# Patient Record
Sex: Male | Born: 1970 | Race: Black or African American | Hispanic: No | Marital: Single | Smoking: Never smoker
Health system: Southern US, Community
[De-identification: ages and names within clinical notes are randomized; demographics above are authoritative.]

## PROBLEM LIST (undated history)

## (undated) DIAGNOSIS — R7303 Prediabetes: Secondary | ICD-10-CM

## (undated) DIAGNOSIS — Z8611 Personal history of tuberculosis: Secondary | ICD-10-CM

## (undated) DIAGNOSIS — L03221 Cellulitis of neck: Secondary | ICD-10-CM

## (undated) HISTORY — PX: MYRINGOTOMY: SUR874

## (undated) HISTORY — PX: VASECTOMY: SHX75

## (undated) HISTORY — PX: APPENDECTOMY: SHX54

## (undated) HISTORY — DX: Personal history of tuberculosis: Z86.11

## (undated) HISTORY — DX: Prediabetes: R73.03

---

## 2011-05-20 ENCOUNTER — Encounter (HOSPITAL_COMMUNITY): Payer: Self-pay | Admitting: *Deleted

## 2011-05-20 ENCOUNTER — Inpatient Hospital Stay (HOSPITAL_COMMUNITY)
Admission: EM | Admit: 2011-05-20 | Discharge: 2011-05-22 | DRG: 603 | Disposition: A | Discharge status: Home / Self Care | Payer: Self-pay | Source: Ambulatory Visit | Attending: Internal Medicine | Admitting: Internal Medicine

## 2011-05-20 ENCOUNTER — Emergency Department (HOSPITAL_COMMUNITY): Payer: Self-pay

## 2011-05-20 DIAGNOSIS — D72829 Elevated white blood cell count, unspecified: Secondary | ICD-10-CM | POA: Diagnosis present

## 2011-05-20 DIAGNOSIS — R509 Fever, unspecified: Secondary | ICD-10-CM | POA: Diagnosis present

## 2011-05-20 DIAGNOSIS — L0211 Cutaneous abscess of neck: Principal | ICD-10-CM | POA: Diagnosis present

## 2011-05-20 DIAGNOSIS — L03221 Cellulitis of neck: Secondary | ICD-10-CM | POA: Diagnosis present

## 2011-05-20 HISTORY — DX: Cellulitis of neck: L03.221

## 2011-05-20 LAB — BASIC METABOLIC PANEL
CO2: 27 mEq/L (ref 19–32)
Chloride: 104 mEq/L (ref 96–112)
GFR calc non Af Amer: 88 mL/min — ABNORMAL LOW (ref >90)
Glucose, Bld: 93 mg/dL (ref 70–99)
Potassium: 3.9 mEq/L (ref 3.5–5.1)
Sodium: 141 mEq/L (ref 135–145)

## 2011-05-20 LAB — DIFFERENTIAL
Lymphocytes Relative: 19 % (ref 12–46)
Lymphs Abs: 2.1 10*3/uL (ref 0.7–4.0)
Monocytes Relative: 10 % (ref 3–12)
Neutro Abs: 8 10*3/uL — ABNORMAL HIGH (ref 1.7–7.7)
Neutrophils Relative %: 71 % (ref 43–77)

## 2011-05-20 LAB — CBC
Hemoglobin: 15.1 g/dL (ref 13.0–17.0)
Platelets: 183 10*3/uL (ref 150–400)
RBC: 4.97 MIL/uL (ref 4.22–5.81)
WBC: 11.3 10*3/uL — ABNORMAL HIGH (ref 4.0–10.5)

## 2011-05-20 MED ORDER — MORPHINE SULFATE 4 MG/ML IJ SOLN
4.0000 mg | INTRAMUSCULAR | Status: DC | PRN
  Administered 2011-05-20 – 2011-05-21 (×3): 4 mg via INTRAVENOUS
  Filled 2011-05-20 (×3): qty 1

## 2011-05-20 MED ORDER — ACETAMINOPHEN 325 MG PO TABS
ORAL_TABLET | ORAL | Status: AC
  Filled 2011-05-20: qty 2

## 2011-05-20 MED ORDER — SODIUM CHLORIDE 0.9 % IV BOLUS (SEPSIS)
1000.0000 mL | Freq: Once | INTRAVENOUS | Status: AC
  Administered 2011-05-20: 1000 mL via INTRAVENOUS

## 2011-05-20 MED ORDER — VANCOMYCIN HCL IN DEXTROSE 1-5 GM/200ML-% IV SOLN: 1000.0000 mg | Freq: Once | INTRAVENOUS | Status: DC

## 2011-05-20 MED ORDER — ACETAMINOPHEN 325 MG PO TABS
650.0000 mg | ORAL_TABLET | Freq: Once | ORAL | Status: AC
  Administered 2011-05-20: 650 mg via ORAL

## 2011-05-20 MED ORDER — MORPHINE SULFATE 4 MG/ML IJ SOLN
4.0000 mg | Freq: Once | INTRAMUSCULAR | Status: AC
  Administered 2011-05-20: 4 mg via INTRAVENOUS
  Filled 2011-05-20: qty 1

## 2011-05-20 MED ORDER — ACETAMINOPHEN 325 MG PO TABS
650.0000 mg | ORAL_TABLET | Freq: Once | ORAL | Status: AC
  Administered 2011-05-20: 650 mg via ORAL
  Filled 2011-05-20: qty 2

## 2011-05-20 MED ORDER — ONDANSETRON HCL 4 MG/2ML IJ SOLN
4.0000 mg | Freq: Four times a day (QID) | INTRAMUSCULAR | Status: DC | PRN
  Administered 2011-05-21: 4 mg via INTRAVENOUS
  Filled 2011-05-20: qty 2

## 2011-05-20 MED ORDER — IOHEXOL 300 MG/ML  SOLN
80.0000 mL | Freq: Once | INTRAMUSCULAR | Status: AC | PRN
  Administered 2011-05-20: 80 mL via INTRAVENOUS

## 2011-05-20 MED ORDER — MORPHINE SULFATE 4 MG/ML IJ SOLN
4.0000 mg | Freq: Once | INTRAMUSCULAR | Status: AC
  Administered 2011-05-20: 4 mg via INTRAVENOUS

## 2011-05-20 MED ORDER — MORPHINE SULFATE 4 MG/ML IJ SOLN
INTRAMUSCULAR | Status: AC
  Filled 2011-05-20: qty 1

## 2011-05-20 MED ORDER — CLINDAMYCIN PHOSPHATE 600 MG/50ML IV SOLN
600.0000 mg | Freq: Three times a day (TID) | INTRAVENOUS | Status: DC
  Administered 2011-05-20 – 2011-05-22 (×5): 600 mg via INTRAVENOUS
  Filled 2011-05-20 (×10): qty 50

## 2011-05-20 MED ORDER — CLINDAMYCIN PHOSPHATE 600 MG/50ML IV SOLN
600.0000 mg | INTRAVENOUS | Status: AC
  Administered 2011-05-20: 600 mg via INTRAVENOUS
  Filled 2011-05-20: qty 50

## 2011-05-20 MED ORDER — ONDANSETRON HCL 4 MG/2ML IJ SOLN
4.0000 mg | Freq: Once | INTRAMUSCULAR | Status: AC
  Administered 2011-05-20: 4 mg via INTRAVENOUS
  Filled 2011-05-20: qty 2

## 2011-05-20 NOTE — ED Notes (Signed)
Patient transported to CT 

## 2011-05-20 NOTE — ED Notes (Signed)
Received report from Janet, RN.

## 2011-05-20 NOTE — ED Notes (Signed)
Current iv in rt hand is not flowing. Pt needs larger iv for ct with contrast. Attempted iv x 2 without success. IV team paged to access iv site.

## 2011-05-20 NOTE — ED Notes (Signed)
Received pt after report, pt is resting states neck pain is 4/10 when he is laying still. States pain increases to 10/10 when he moves his neck. Pt states current pain level is acceptable and denies any needs. Neck is warm, red and swollen. Wound is within previously marked edges. IVF infusing without difficulty. Will continue to monitor pt

## 2011-05-20 NOTE — ED Notes (Signed)
Pt alert getting blood drawn.  No distress

## 2011-05-20 NOTE — ED Notes (Addendum)
Pt ambulatory to ct scan

## 2011-05-20 NOTE — ED Provider Notes (Signed)
History     CSN: [id number]  Arrival date & time 05/20/11  0945   First MD Initiated Contact with Patient 05/20/11 1020      Chief Complaint  Patient presents with  . Neck Pain  . Fever    (Consider location/radiation/quality/duration/timing/severity/associated sxs/prior treatment) HPI  History reviewed. No pertinent past medical history.  History reviewed. No pertinent past surgical history.  History reviewed. No pertinent family history.  History  Substance Use Topics  . Smoking status: Never Smoker   . Smokeless tobacco: Not on file  . Alcohol Use: No      Review of Systems  Allergies  Review of patient's allergies indicates no known allergies.  Home Medications   Current Outpatient Rx  Name Route Sig Dispense Refill  . IBUPROFEN 800 MG PO TABS Oral Take 800 mg by mouth every 8 (eight) hours as needed.    Marland Kitchen OVER THE COUNTER MEDICATION Topical Apply 1 application topically daily as needed.      BP 121/78  Pulse 98  Temp(Src) 98.9 F (37.2 C) (Oral)  Resp 20  SpO2 99%  Physical Exam  ED Course  Procedures (including critical care time)  Labs Reviewed  CBC - Abnormal; Notable for the following:    WBC 11.3 (*)    All other components within normal limits  DIFFERENTIAL - Abnormal; Notable for the following:    Neutro Abs 8.0 (*)    Monocytes Absolute 1.2 (*)    All other components within normal limits  BASIC METABOLIC PANEL - Abnormal; Notable for the following:    GFR calc non Af Amer 88 (*)    All other components within normal limits   Ct Soft Tissue Neck W Contrast  05/20/2011  *RADIOLOGY REPORT*  Clinical Data: Left neck pain/swelling  CT NECK WITH CONTRAST  Technique:  Multidetector CT imaging of the neck was performed with intravenous contrast.  Contrast: 80mL OMNIPAQUE IOHEXOL 300 MG/ML IV SOLN  Comparison: None.  Findings: Patent airway.  No evidence of tonsillar/peritonsillar abscess.  No suspicious cervical lymphadenopathy.  Mild  subcutaneous soft tissue stranding along the right anterior neck (series 3/image 82), possibly reflecting cellulitis.  Visualized thyroid is unremarkable.  Mild degenerative changes of the cervical spine with prior posterior fixation/interbody fusion from C5-C7.  Visualized lung apices are clear.  IMPRESSION: Mild subcutaneous soft tissue stranding along the right anterior neck, possibly reflecting cellulitis.  No drainable fluid collection or abscess.  Patent airway.  Original Report Authenticated By: Charline Bills, M.D.     1. Cellulitis of neck     11:44 AM Handoff from Hunt PA-C at 11:15am. Patient to CDU on cellulitis protocol. Will be given IV clinda. Labs ordered. Will monitor for improvement.   1:10 PM patient seen and examined. CT reviewed by myself. Will monitor, plan additional dose of clindamycin in 6 hours.  1:10 PM Exam:  Gen NAD;Neck diffuse erythema and soft tissue swelling of right lateral neck, patient can swallow and is handling secretions Heart RRR, nml S1,S2, no m/r/g; Lungs CTAB; Abd soft, NT, no rebound or guarding; Ext 2+ pedal pulses bilaterally, no edema.  4:03 PM patient seen. No complaints.  11:00 PM patient seen earlier. Area of cellulitis is stable. The patient agrees to stay for the next dose of clindamycin which will be administered at approximately 5 AM. Handoff to Dr. Karma Ganja.  Plan: Discharge patient to home on oral antibiotics if medically stable or improving. Consider admission if worsening.  MDM  Cellulitis of neck,  continuing treatment in the CDU.       Eustace Moore [place], [place] 05/20/11 [phone]

## 2011-05-20 NOTE — ED Notes (Addendum)
Pt reports left neck discomfort since Thursday. On exam left side on neck is swollen and very warm to the touch. Pt reports he has felt like he has had fevers. Airway intact. Reports painful swallowing, states it feels like muscles in neck are swollen.

## 2011-05-20 NOTE — ED Notes (Signed)
Called for report from Herbert Deaner, RN

## 2011-05-20 NOTE — ED Provider Notes (Signed)
History     CSN: [id number]  Arrival date & time 05/20/11  0945   First MD Initiated Contact with Patient 05/20/11 1020      Chief Complaint  Patient presents with  . Neck Pain  . Fever    (Consider location/radiation/quality/duration/timing/severity/associated sxs/prior treatment) HPI  Patient presents to emergency department complaining of right anterior neck pain redness, swelling, and heat stating that on Thursday he shaved and had what appeared to be an ingrown hair that on Friday with your taken with faint area of redness and tenderness that by yesterday had grown in both redness and tenderness with pain with swallowing. Patient states pain is aggravated by movement and swallowing however is able to swallow secretions. Patient states he had a fever of 101 at home. Patient states she's taken over-the-counter ibuprofen and apply heat with mild improvement in pain ongoing redness and swelling. Patient has no known medical problems and takes no medicine on regular basis. Patient lives in [place] and is visiting [place] temporarily with plans to return to [place] within the next week. Patient states he has an established primary care physician in [place]. Patient denies headache, visual changes, earache, sore throat, chest pain, shortness of breath, swelling of tongue, swelling of lips, or swelling of the inside of throat. Symptoms were gradual onset, persistent, and worsening. Patient states he still has a small "pimple" at the site of the original ingrown hair.  History reviewed. No pertinent past medical history.  History reviewed. No pertinent past surgical history.  History reviewed. No pertinent family history.  History  Substance Use Topics  . Smoking status: Never Smoker   . Smokeless tobacco: Not on file  . Alcohol Use: No      Review of Systems  All other systems reviewed and are negative.    Allergies  Review of patient's allergies indicates no known  allergies.  Home Medications   Current Outpatient Rx  Name Route Sig Dispense Refill  . IBUPROFEN 800 MG PO TABS Oral Take 800 mg by mouth every 8 (eight) hours as needed.    Marland Kitchen OVER THE COUNTER MEDICATION Topical Apply 1 application topically daily as needed.      BP 121/78  Pulse 98  Temp(Src) 98.9 F (37.2 C) (Oral)  Resp 20  SpO2 99%  Physical Exam  Nursing note and vitals reviewed. Constitutional: He is oriented to person, place, and time. He appears well-developed and well-nourished. No distress.  HENT:  Head: Normocephalic and atraumatic.       Patent airway, swallowing secretions well. No pharyngeal or tonsillar erythema or exudate.  Eyes: Conjunctivae are normal.  Neck: Normal range of motion. Neck supple. No tracheal deviation present.       Pinpoint pustule of right anterior upper neck with large area of surrounding erythema tenderness to palpation but no fluctuance. No induration. Trachea midline. Patient swallowing secretions well.  Cardiovascular: Normal rate, regular rhythm, normal heart sounds and intact distal pulses.  Exam reveals no gallop and no friction rub.   No murmur heard. Pulmonary/Chest: Effort normal and breath sounds normal. No stridor. No respiratory distress. He has no wheezes. He has no rales. He exhibits no tenderness.  Abdominal: Soft. Bowel sounds are normal. He exhibits no distension and no mass. There is no tenderness. There is no rebound and no guarding.  Musculoskeletal: Normal range of motion. He exhibits no edema and no tenderness.  Lymphadenopathy:    He has cervical adenopathy.  Neurological: He is alert  and oriented to person, place, and time.  Skin: Skin is warm and dry. No rash noted. He is not diaphoretic. There is erythema.  Psychiatric: He has a normal mood and affect.    ED Course  Procedures (including critical care time)   IV vein, IV morphine, and IV Zofran.  Patient reviewed with Rhea Bleacher, physician assistant, who  will continue to follow the patient in the CDU with a CT soft tissue neck scan pending while patient is receiving IV fluids and IV antibiotics. Disposition pending CT scan results. Patient is stable and afebrile.  Labs Reviewed  CBC - Abnormal; Notable for the following:    WBC 11.3 (*)    All other components within normal limits  DIFFERENTIAL - Abnormal; Notable for the following:    Neutro Abs 8.0 (*)    Monocytes Absolute 1.2 (*)    All other components within normal limits  BASIC METABOLIC PANEL - Abnormal; Notable for the following:    GFR calc non Af Amer 88 (*)    All other components within normal limits   Ct Soft Tissue Neck W Contrast  05/20/2011  *RADIOLOGY REPORT*  Clinical Data: Left neck pain/swelling  CT NECK WITH CONTRAST  Technique:  Multidetector CT imaging of the neck was performed with intravenous contrast.  Contrast: 80mL OMNIPAQUE IOHEXOL 300 MG/ML IV SOLN  Comparison: None.  Findings: Patent airway.  No evidence of tonsillar/peritonsillar abscess.  No suspicious cervical lymphadenopathy.  Mild subcutaneous soft tissue stranding along the right anterior neck (series 3/image 82), possibly reflecting cellulitis.  Visualized thyroid is unremarkable.  Mild degenerative changes of the cervical spine with prior posterior fixation/interbody fusion from C5-C7.  Visualized lung apices are clear.  IMPRESSION: Mild subcutaneous soft tissue stranding along the right anterior neck, possibly reflecting cellulitis.  No drainable fluid collection or abscess.  Patent airway.  Original Report Authenticated By: Charline Bills, M.D.     No diagnosis found.    MDM  Rhea Bleacher following patient. VSS. Afebrile. Airway patent. Cellulitis of neck.         [place], [place] 05/20/11 1649

## 2011-05-21 ENCOUNTER — Encounter (HOSPITAL_COMMUNITY): Payer: Self-pay | Admitting: General Practice

## 2011-05-21 DIAGNOSIS — L03221 Cellulitis of neck: Secondary | ICD-10-CM

## 2011-05-21 HISTORY — DX: Cellulitis of neck: L03.221

## 2011-05-21 LAB — CBC
Hemoglobin: 13.3 g/dL (ref 13.0–17.0)
MCH: 30.4 pg (ref 26.0–34.0)
MCV: 88.8 fL (ref 78.0–100.0)
RBC: 4.37 MIL/uL (ref 4.22–5.81)
WBC: 12.5 10*3/uL — ABNORMAL HIGH (ref 4.0–10.5)

## 2011-05-21 LAB — SEDIMENTATION RATE: Sed Rate: 24 mm/hr — ABNORMAL HIGH (ref 0–16)

## 2011-05-21 LAB — MAGNESIUM: Magnesium: 2.2 mg/dL (ref 1.5–2.5)

## 2011-05-21 MED ORDER — CLINDAMYCIN PHOSPHATE 600 MG/50ML IV SOLN
600.0000 mg | Freq: Three times a day (TID) | INTRAVENOUS | Status: DC
  Administered 2011-05-21: 600 mg via INTRAVENOUS
  Filled 2011-05-21 (×3): qty 50

## 2011-05-21 MED ORDER — ONDANSETRON HCL 4 MG/2ML IJ SOLN: 4.0000 mg | Freq: Three times a day (TID) | INTRAMUSCULAR | Status: DC | PRN

## 2011-05-21 MED ORDER — ENOXAPARIN SODIUM 40 MG/0.4ML ~~LOC~~ SOLN
40.0000 mg | Freq: Every day | SUBCUTANEOUS | Status: DC
  Administered 2011-05-21: 40 mg via SUBCUTANEOUS
  Filled 2011-05-21 (×2): qty 0.4

## 2011-05-21 MED ORDER — DEXTROSE 5 % IV SOLN
INTRAVENOUS | Status: AC
  Filled 2011-05-21: qty 50

## 2011-05-21 MED ORDER — ONDANSETRON HCL 4 MG PO TABS: 4.0000 mg | ORAL_TABLET | Freq: Four times a day (QID) | ORAL | Status: DC | PRN

## 2011-05-21 MED ORDER — MORPHINE SULFATE 2 MG/ML IJ SOLN
1.0000 mg | INTRAMUSCULAR | Status: DC | PRN
  Administered 2011-05-22: 1 mg via INTRAVENOUS
  Filled 2011-05-21: qty 1

## 2011-05-21 MED ORDER — SODIUM CHLORIDE 0.9 % IJ SOLN
3.0000 mL | Freq: Two times a day (BID) | INTRAMUSCULAR | Status: DC
  Administered 2011-05-21 – 2011-05-22 (×2): 3 mL via INTRAVENOUS

## 2011-05-21 MED ORDER — SODIUM CHLORIDE 0.9 % IV SOLN: 250.0000 mL | INTRAVENOUS | Status: DC | PRN

## 2011-05-21 MED ORDER — POLYETHYLENE GLYCOL 3350 17 G PO PACK
17.0000 g | PACK | Freq: Every day | ORAL | Status: DC | PRN
  Filled 2011-05-21: qty 1

## 2011-05-21 MED ORDER — SODIUM CHLORIDE 0.9 % IJ SOLN
3.0000 mL | INTRAMUSCULAR | Status: DC | PRN
  Administered 2011-05-21: 3 mL via INTRAVENOUS

## 2011-05-21 MED ORDER — SODIUM CHLORIDE 0.9 % IV SOLN
INTRAVENOUS | Status: AC
  Administered 2011-05-21: 14:00:00 via INTRAVENOUS

## 2011-05-21 MED ORDER — CLINDAMYCIN PHOSPHATE 600 MG/4ML IJ SOLN
INTRAMUSCULAR | Status: AC
  Filled 2011-05-21: qty 4

## 2011-05-21 MED ORDER — HYDROMORPHONE HCL PF 1 MG/ML IJ SOLN
1.0000 mg | INTRAMUSCULAR | Status: DC | PRN
  Administered 2011-05-21: 1 mg via INTRAVENOUS
  Filled 2011-05-21: qty 1

## 2011-05-21 MED ORDER — ONDANSETRON HCL 4 MG/2ML IJ SOLN: 4.0000 mg | Freq: Four times a day (QID) | INTRAMUSCULAR | Status: DC | PRN

## 2011-05-21 MED ORDER — ACETAMINOPHEN 325 MG PO TABS
650.0000 mg | ORAL_TABLET | ORAL | Status: DC | PRN
  Administered 2011-05-21: 650 mg via ORAL
  Filled 2011-05-21: qty 2

## 2011-05-21 MED ORDER — HYDROCODONE-ACETAMINOPHEN 5-325 MG PO TABS: 1.0000 | ORAL_TABLET | ORAL | Status: DC | PRN

## 2011-05-21 MED ORDER — ACETAMINOPHEN 325 MG PO TABS: 650.0000 mg | ORAL_TABLET | Freq: Four times a day (QID) | ORAL | Status: DC | PRN

## 2011-05-21 MED ORDER — ACETAMINOPHEN 650 MG RE SUPP: 650.0000 mg | Freq: Four times a day (QID) | RECTAL | Status: DC | PRN

## 2011-05-21 MED ORDER — MORPHINE SULFATE 4 MG/ML IJ SOLN: 4.0000 mg | Freq: Once | INTRAMUSCULAR | Status: DC

## 2011-05-21 NOTE — ED Provider Notes (Signed)
Medical screening examination/treatment/procedure(s) were conducted as a shared visit with non-physician practitioner(s) and myself.  I personally evaluated the patient during the encounter.  Eval. Most c/w cellulitis, cannot r/o deep infection clinically.  Flint Melter, MD 05/21/11 [phone]

## 2011-05-21 NOTE — ED Provider Notes (Signed)
7:40 AM Patient is in CDU under observation overnight, cellulitis protocol.  Patient states he continues to have some pain, feels that he is developing a collection in his anterior neck.  Continues to have pain with swallowing and difficulty swallowing large solids.  On exam, pt is A&Ox4, NAD, tachycardic, lungs CTAB without stridor, anterior neck with erythema, edema, warmth - erythema is outside of drawn dotted line in several places - including approximately 3 cm increase to left lateral neck.  Pt has had two doses of clindamycin thus far.  Plan is for admission.    Admitted to [facility].       Rise Patience, [place] 05/21/11 1309

## 2011-05-21 NOTE — ED Notes (Signed)
Pt appears to be sleeping resp even and unlabored, no distress noted

## 2011-05-21 NOTE — ED Notes (Signed)
Vomited x 2. Pt medicated as ordered. Pt states pain has been relieved. Devon Martinez

## 2011-05-21 NOTE — ED Provider Notes (Signed)
Medical screening examination/treatment/procedure(s) were performed by non-physician practitioner and as supervising physician I was immediately available for consultation/collaboration.  Ethelda Chick, MD 05/21/11 [phone]

## 2011-05-21 NOTE — Progress Notes (Signed)
Utilization Review Completed.Laiyah Exline T1/21/2013   

## 2011-05-21 NOTE — ED Notes (Signed)
Pt medicated for fever 101.2, pt states pain is 4/10 and tolerable at this time. No further needs at this time. Pt updated on plan of care

## 2011-05-21 NOTE — ED Provider Notes (Signed)
Medical screening examination/treatment/procedure(s) were performed by non-physician practitioner and as supervising physician I was immediately available for consultation/collaboration.  Flint Melter, MD 05/21/11 [phone]

## 2011-05-21 NOTE — ED Notes (Signed)
Anterior neck swelling and redness have decreased from the collar bone area up to the middle of the anterior neck. Obvious induration noted and area is firm to the touch, redness and warmth are present. Pt states pain has improved to 3/10 and states this is tolerable. Will continue to monitor pt

## 2011-05-21 NOTE — H&P (Signed)
PCP:   Sheila Oats, MD, MD   Chief Complaint:  Erythema, swelling and neck pain  HPI: 41 year old male without any significant past medical history; who came to the hospital secondary to 4 days of fever, erythema, pain and swelling on the right side of his neck. Patient reports that on Thursday after he shaved, he felt a hair bump, which was extracted by his significant other; 1-2 days later he felt induration in that area of significant pain. Next consecutive days patient reports having intermittent fever, pain and some difficulty swallowing. Just 2 days prior to admission the patient noticed some erythema in the right anterior neck area and decided to look for medical attention. In ED a CT scan of the neck demonstrated swelling and inflammation of the soft tissue but no abscess. Patient received 24 hours of IV antibiotics in the CDU, but despite treatment today erythema looks worse than yesterday and [facility] has been called for admission for further evaluation and treatment.  Allergies:  No Known Allergies    Past Medical History  Diagnosis Date  . Cellulitis of neck 05/21/11    anterior    Past Surgical History  Procedure Date  . Myringotomy ~ 1987    bilaterally    Prior to Admission medications   Medication Sig Start Date End Date Taking? Authorizing Provider  ibuprofen (ADVIL,MOTRIN) 800 MG tablet Take 800 mg by mouth every 8 (eight) hours as needed.   Yes Historical Provider, MD  OVER THE COUNTER MEDICATION Apply 1 application topically daily as needed.   Yes Historical Provider, MD    Social History:  reports that he has never smoked. He has never used smokeless tobacco. He reports that he does not drink alcohol or use illicit drugs.  History reviewed. No pertinent family history.  Review of Systems:  Negative except as mentioned on history of present illness.   Physical Exam: Blood pressure 124/80, pulse 87, temperature 97.9 F (36.6 C), temperature  source Oral, resp. rate 18, SpO2 96.00%. General: Alert, awake and oriented x3, in no acute distress. Cooperative to examination. HENT: Normocephalic, atraumatic; PERRLA, EOMI; moist mucous membranes, no erythema or exudates inside her mouth. Nostril without any discharges. Neck: Right-sided erythema, tenderness to the patient, skin induration and swelling. No drainage Heart: Regular rate and rhythm, no murmurs, no gallops or rubs. Respiratory system: Clear to auscultation bilaterally. Abdomen: Soft, nontender, nondistended, positive bowel sounds. Extremities: No cyanosis, no edema or clubbing. Good pulses bilaterally. Skin: No other abnormalities seen on his skin other than what has been report on neck exam. Neurology: Cranial nerve 2-12 grossly intact, muscle strength 5 out of 5 bilaterally symmetrically, no focal motor deficit.   Labs on Admission:  Results for orders placed during the hospital encounter of 05/20/11 (from the past 48 hour(s))  CBC     Status: Abnormal   Collection Time   05/20/11 11:16 AM      Component Value Range Comment   WBC 11.3 (*) 4.0 - 10.5 (K/uL)    RBC 4.97  4.22 - 5.81 (MIL/uL)    Hemoglobin 15.1  13.0 - 17.0 (g/dL)    HCT 16.1  09.6 - 04.5 (%)    MCV 89.5  78.0 - 100.0 (fL)    MCH 30.4  26.0 - 34.0 (pg)    MCHC 33.9  30.0 - 36.0 (g/dL)    RDW 40.9  81.1 - 91.4 (%)    Platelets 183  150 - 400 (K/uL)   DIFFERENTIAL  Status: Abnormal   Collection Time   05/20/11 11:16 AM      Component Value Range Comment   Neutrophils Relative 71  43 - 77 (%)    Neutro Abs 8.0 (*) 1.7 - 7.7 (K/uL)    Lymphocytes Relative 19  12 - 46 (%)    Lymphs Abs 2.1  0.7 - 4.0 (K/uL)    Monocytes Relative 10  3 - 12 (%)    Monocytes Absolute 1.2 (*) 0.1 - 1.0 (K/uL)    Eosinophils Relative 0  0 - 5 (%)    Eosinophils Absolute 0.0  0.0 - 0.7 (K/uL)    Basophils Relative 0  0 - 1 (%)    Basophils Absolute 0.0  0.0 - 0.1 (K/uL)   BASIC METABOLIC PANEL     Status: Abnormal    Collection Time   05/20/11 11:16 AM      Component Value Range Comment   Sodium 141  135 - 145 (mEq/L)    Potassium 3.9  3.5 - 5.1 (mEq/L)    Chloride 104  96 - 112 (mEq/L)    CO2 27  19 - 32 (mEq/L)    Glucose, Bld 93  70 - 99 (mg/dL)    BUN 16  6 - 23 (mg/dL)    Creatinine, Ser 0.45  0.50 - 1.35 (mg/dL)    Calcium 9.9  8.4 - 10.5 (mg/dL)    GFR calc non Af Amer 88 (*) >90 (mL/min)    GFR calc Af Amer >90  >90 (mL/min)   RAPID HIV SCREEN ([facility])     Status: Normal   Collection Time   05/21/11 11:57 AM      Component Value Range Comment   SUDS Rapid HIV Screen NON REACTIVE  NON REACTIVE      Radiological Exams on Admission: Ct Soft Tissue Neck W Contrast  05/20/2011  *RADIOLOGY REPORT*  Clinical Data: Left neck pain/swelling  CT NECK WITH CONTRAST  Technique:  Multidetector CT imaging of the neck was performed with intravenous contrast.  Contrast: 80mL OMNIPAQUE IOHEXOL 300 MG/ML IV SOLN  Comparison: None.  Findings: Patent airway.  No evidence of tonsillar/peritonsillar abscess.  No suspicious cervical lymphadenopathy.  Mild subcutaneous soft tissue stranding along the right anterior neck (series 3/image 82), possibly reflecting cellulitis.  Visualized thyroid is unremarkable.  Mild degenerative changes of the cervical spine with prior posterior fixation/interbody fusion from C5-C7.  Visualized lung apices are clear.  IMPRESSION: Mild subcutaneous soft tissue stranding along the right anterior neck, possibly reflecting cellulitis.  No drainable fluid collection or abscess.  Patent airway.  Original Report Authenticated By: Charline Bills, M.D.    Assessment/Plan 1-Cellulitis of neck: No signs of abscess on CT scan performed in the ED. Will apply physical measures at in cold compresses 4 times a day, continue clindamycin IV. Continue pain medications as needed. We'll follow his symptoms and if by tomorrow is improving we're going to change antibiotics by mouth and discharge patient home.  Patient has been screen for HIV (nonreactive); will check MRSA screening.  2-leukocytosis: Secondary to problem #1. We'll check CBC in a.m. he wanted to follow WBCs trend; continue antibiotics.  3-fever: As associated with cellulitis. Continue Tylenol as needed.  4-DVT: Lovenox.   Time Spent on Admission: 45 minutes  Chalon Zobrist [facility] [phone]  05/21/2011, 5:54 PM

## 2011-05-22 LAB — CBC
HCT: 35.7 % — ABNORMAL LOW (ref 39.0–52.0)
Hemoglobin: 13.2 g/dL (ref 13.0–17.0)
MCH: 32.3 pg (ref 26.0–34.0)
MCHC: 37 g/dL — ABNORMAL HIGH (ref 30.0–36.0)
RBC: 4.09 MIL/uL — ABNORMAL LOW (ref 4.22–5.81)

## 2011-05-22 LAB — COMPREHENSIVE METABOLIC PANEL
ALT: 16 U/L (ref 0–53)
Alkaline Phosphatase: 56 U/L (ref 39–117)
BUN: 10 mg/dL (ref 6–23)
CO2: 27 mEq/L (ref 19–32)
GFR calc Af Amer: >90 mL/min (ref >90)
GFR calc non Af Amer: >90 mL/min (ref >90)
Glucose, Bld: 105 mg/dL — ABNORMAL HIGH (ref 70–99)
Potassium: 4 mEq/L (ref 3.5–5.1)
Total Protein: 7.3 g/dL (ref 6.0–8.3)

## 2011-05-22 MED ORDER — IBUPROFEN 800 MG PO TABS: 400.0000 mg | ORAL_TABLET | Freq: Three times a day (TID) | ORAL | Status: DC | PRN

## 2011-05-22 MED ORDER — CLINDAMYCIN HCL 300 MG PO CAPS: 600.0000 mg | ORAL_CAPSULE | Freq: Four times a day (QID) | ORAL | Status: AC

## 2011-05-22 MED ORDER — HYDROCODONE-ACETAMINOPHEN 5-500 MG PO TABS: 1.0000 | ORAL_TABLET | Freq: Four times a day (QID) | ORAL | Status: AC | PRN

## 2011-05-22 NOTE — Discharge Summary (Signed)
Physician Discharge Summary  Patient ID: Glenwood Revoir MRN: [id number] DOB/AGE: Jan 13, 1971 41 y.o.  Admit date: 05/20/2011 Discharge date: 05/22/2011  Primary Care Physician:  Dr. Lucianne Muss.   Discharge Diagnoses:   1-Cellulitis of neck 2-Fever 3-Leukocytosis  Medication List  As of 05/22/2011  9:49 AM   STOP taking these medications         OVER THE COUNTER MEDICATION         TAKE these medications         clindamycin 300 MG capsule   Commonly known as: CLEOCIN   Take 2 capsules (600 mg total) by mouth 4 (four) times daily.      HYDROcodone-acetaminophen 5-500 MG per tablet   Commonly known as: VICODIN   Take 1 tablet by mouth every 6 (six) hours as needed for pain (that failed to improved with use of ibuprofen).      ibuprofen 800 MG tablet   Commonly known as: ADVIL,MOTRIN   Take 0.5 tablets (400 mg total) by mouth every 8 (eight) hours as needed for pain or fever.             Disposition and Follow-up:  Patient discharge in stable and improved condition; he has been advised to finish antibiotics as prescribed and to follow with PCP in 2 weeks. Home care instructions has also been provided.  Consults:   None   Significant Diagnostic Studies:  Ct Soft Tissue Neck W Contrast  05/20/2011  *RADIOLOGY REPORT*  Clinical Data: Left neck pain/swelling  CT NECK WITH CONTRAST  Technique:  Multidetector CT imaging of the neck was performed with intravenous contrast.  Contrast: 80mL OMNIPAQUE IOHEXOL 300 MG/ML IV SOLN  Comparison: None.  Findings: Patent airway.  No evidence of tonsillar/peritonsillar abscess.  No suspicious cervical lymphadenopathy.  Mild subcutaneous soft tissue stranding along the right anterior neck (series 3/image 82), possibly reflecting cellulitis.  Visualized thyroid is unremarkable.  Mild degenerative changes of the cervical spine with prior posterior fixation/interbody fusion from C5-C7.  Visualized lung apices are clear.  IMPRESSION: Mild subcutaneous  soft tissue stranding along the right anterior neck, possibly reflecting cellulitis.  No drainable fluid collection or abscess.  Patent airway.  Original Report Authenticated By: Charline Bills, M.D.    Brief H and P: 41 year old male without any significant past medical history; who came to the hospital secondary to 4 days of fever, erythema, pain and swelling on the right side of his neck. Patient reports that on Thursday after he shaved, he felt a hair bump, which was extracted by his significant other; 1-2 days later he felt induration in that area of significant pain. Next consecutive days patient reports having intermittent fever, pain and some difficulty swallowing. Just 2 days prior to admission the patient noticed some erythema in the right anterior neck area and decided to look for medical attention. In ED a CT scan of the neck demonstrated swelling and inflammation of the soft tissue but no abscess.   Hospital Course:  1-Cellulitis of neck: significant improved in swelling, pain and erythema; patient stable to be discharge home with PO antibiotics and follow up with PCP. No further fever and WBC's essentially WNL's.   Time spent on Discharge: 35 minutes.  Signed: Salmaan Patchin 05/22/2011, 9:49 AM

## 2017-05-07 DIAGNOSIS — M545 Low back pain: Secondary | ICD-10-CM | POA: Diagnosis not present

## 2017-05-07 DIAGNOSIS — R05 Cough: Secondary | ICD-10-CM | POA: Diagnosis not present

## 2017-05-07 DIAGNOSIS — R0981 Nasal congestion: Secondary | ICD-10-CM | POA: Diagnosis not present

## 2017-05-07 DIAGNOSIS — H7291 Unspecified perforation of tympanic membrane, right ear: Secondary | ICD-10-CM | POA: Diagnosis not present

## 2017-05-07 DIAGNOSIS — Z6825 Body mass index (BMI) 25.0-25.9, adult: Secondary | ICD-10-CM | POA: Diagnosis not present

## 2017-05-13 ENCOUNTER — Ambulatory Visit: Payer: BLUE CROSS/BLUE SHIELD | Admitting: Family Medicine

## 2017-05-13 ENCOUNTER — Ambulatory Visit: Payer: Self-pay | Admitting: Physician Assistant

## 2017-05-13 ENCOUNTER — Encounter: Payer: Self-pay | Admitting: Family Medicine

## 2017-05-13 ENCOUNTER — Other Ambulatory Visit: Payer: Self-pay

## 2017-05-13 DIAGNOSIS — M546 Pain in thoracic spine: Secondary | ICD-10-CM

## 2017-05-13 DIAGNOSIS — R1030 Lower abdominal pain, unspecified: Secondary | ICD-10-CM

## 2017-05-13 LAB — POCT URINALYSIS DIP (MANUAL ENTRY)
Bilirubin, UA: NEGATIVE
Blood, UA: NEGATIVE
Glucose, UA: NEGATIVE mg/dL
Ketones, POC UA: NEGATIVE mg/dL
Leukocytes, UA: NEGATIVE
Nitrite, UA: NEGATIVE
Protein Ur, POC: NEGATIVE mg/dL
Spec Grav, UA: 1.02 (ref 1.010–1.025)
Urobilinogen, UA: 0.2 E.U./dL
pH, UA: 7 (ref 5.0–8.0)

## 2017-05-13 MED ORDER — KETOROLAC TROMETHAMINE 30 MG/ML IJ SOLN
30.0000 mg | Freq: Once | INTRAMUSCULAR | Status: AC
  Administered 2017-05-13: 30 mg via INTRAMUSCULAR

## 2017-05-13 MED ORDER — CYCLOBENZAPRINE HCL 10 MG PO TABS: 10.0000 mg | ORAL_TABLET | Freq: Three times a day (TID) | ORAL | 0 refills | Status: DC | PRN

## 2017-05-13 NOTE — Progress Notes (Signed)
1/14/20192:03 PM  Devon Martinez [birth year], 47 y.o. male [id number]  Chief Complaint  Patient presents with  . Back Pain    RADIATING T O THE STOMACH FOR 1 WK    HPI:   Patient is a 47 y.o. male who presents today for right lower back pain that radiates to abd area. Started after doing some heavy pushing. Denies any urinary symptoms, denies any nausea, vomiting, diarrhea or constipation. Denies any fever or chills. Pain is constant, worse with twisting movement. Has not taken anything for it.   Depression screen PHQ 2/9 05/13/2017  Decreased Interest 0  Down, Depressed, Hopeless 0  PHQ - 2 Score 0    No Known Allergies  Prior to Admission medications   Medication Sig Start Date End Date Taking? Authorizing Provider  ibuprofen (ADVIL,MOTRIN) 800 MG tablet Take 0.5 tablets (400 mg total) by mouth every 8 (eight) hours as needed for pain or fever. Patient not taking: Reported on 05/13/2017 05/22/11   Vassie Loll, MD    Past Medical History:  Diagnosis Date  . Cellulitis of neck 05/21/11   anterior    Past Surgical History:  Procedure Laterality Date  . APPENDECTOMY    . MYRINGOTOMY  ~ 1987   bilaterally    Social History   Tobacco Use  . Smoking status: Never Smoker  . Smokeless tobacco: Never Used  Substance Use Topics  . Alcohol use: No    Family History  Problem Relation Age of Onset  . Hypertension Mother   . Cancer Mother   . Hypertension Father   . Cancer Father   . Healthy Sister   . Healthy Brother     ROS Per hpi  OBJECTIVE:  There were no vitals taken for this visit.  Physical Exam  Constitutional: He is oriented to person, place, and time and well-developed, well-nourished, and in no distress.  HENT:  Head: Normocephalic and atraumatic.  Mouth/Throat: Oropharynx is clear and moist.  Eyes: EOM are normal. Pupils are equal, round, and reactive to light.  Neck: Neck supple.  Cardiovascular: Normal rate and regular rhythm. Exam reveals  no gallop and no friction rub.  No murmur heard. Pulmonary/Chest: Effort normal and breath sounds normal. He has no wheezes. He has no rales.  Abdominal: Soft. Bowel sounds are normal. He exhibits no distension. There is no tenderness.  Musculoskeletal:       Lumbar back: He exhibits decreased range of motion, tenderness and spasm. He exhibits no bony tenderness.  Neurological: He is alert and oriented to person, place, and time. Gait normal.  Skin: Skin is warm and dry.     Results for orders placed or performed in visit on 05/13/17 (from the past 24 hour(s))  POCT urinalysis dipstick     Status: None   Collection Time: 05/13/17  1:58 PM  Result Value Ref Range   Color, UA yellow yellow   Clarity, UA clear clear   Glucose, UA negative negative mg/dL   Bilirubin, UA negative negative   Ketones, POC UA negative negative mg/dL   Spec Grav, UA 4.098 1.191 - 1.025   Blood, UA negative negative   pH, UA 7.0 5.0 - 8.0   Protein Ur, POC negative negative mg/dL   Urobilinogen, UA 0.2 0.2 or 1.0 E.U./dL   Nitrite, UA Negative Negative   Leukocytes, UA Negative Negative     ASSESSMENT and PLAN  1. Acute right-sided thoracic back pain Discussed conservative measures with NSAIDs, heat, gentle stretch. New medication  r/se/b reviewed. RTC precautions discussed.  - ketorolac (TORADOL) 30 MG/ML injection 30 mg - cyclobenzaprine (FLEXERIL) 10 MG tablet; Take 1 tablet (10 mg total) by mouth 3 (three) times daily as needed for muscle spasms.  2. Lower abdominal pain - POCT urinalysis dipstick  Other orders   Return if symptoms worsen or fail to improve.    Devon LippsIrma M Santiago, MD Primary Care at [facility] [address] [place], [place] [id number] Ph.  [phone] Fax [phone]

## 2017-05-13 NOTE — Patient Instructions (Addendum)
1. Pain is muscular in origin. Use ibuprofen, muscle relaxants, moist heat and gentle stretching of both back and abdomens.     IF you received an x-ray today, you will receive an invoice from [facility]. Please contact [facility] at [phone] with questions or concerns regarding your invoice.   IF you received labwork today, you will receive an invoice from [facility]. Please contact [facility] at [phone] with questions or concerns regarding your invoice.   Our billing staff will not be able to assist you with questions regarding bills from these companies.  You will be contacted with the lab results as soon as they are available. The fastest way to get your results is to activate your My Chart account. Instructions are located on the last page of this paperwork. If you have not heard from us regarding the results in 2 weeks, please contact this office.

## 2017-09-28 DIAGNOSIS — R7303 Prediabetes: Secondary | ICD-10-CM | POA: Insufficient documentation

## 2017-09-28 HISTORY — DX: Prediabetes: R73.03

## 2017-10-12 ENCOUNTER — Encounter: Payer: BLUE CROSS/BLUE SHIELD | Admitting: Family Medicine

## 2017-10-17 ENCOUNTER — Other Ambulatory Visit: Payer: Self-pay

## 2017-10-17 ENCOUNTER — Encounter: Payer: Self-pay | Admitting: Family Medicine

## 2017-10-17 ENCOUNTER — Ambulatory Visit (INDEPENDENT_AMBULATORY_CARE_PROVIDER_SITE_OTHER): Payer: BLUE CROSS/BLUE SHIELD | Admitting: Family Medicine

## 2017-10-17 VITALS — BP 128/90 | HR 68 | Temp 98.8°F | Resp 16 | Ht 71.5 in | Wt 184.8 lb

## 2017-10-17 DIAGNOSIS — Z13220 Encounter for screening for lipoid disorders: Secondary | ICD-10-CM

## 2017-10-17 DIAGNOSIS — K625 Hemorrhage of anus and rectum: Secondary | ICD-10-CM | POA: Diagnosis not present

## 2017-10-17 DIAGNOSIS — Z130 Encounter for screening for diseases of the blood and blood-forming organs and certain disorders involving the immune mechanism: Secondary | ICD-10-CM

## 2017-10-17 DIAGNOSIS — Z1329 Encounter for screening for other suspected endocrine disorder: Secondary | ICD-10-CM

## 2017-10-17 DIAGNOSIS — Z809 Family history of malignant neoplasm, unspecified: Secondary | ICD-10-CM | POA: Diagnosis not present

## 2017-10-17 DIAGNOSIS — Z125 Encounter for screening for malignant neoplasm of prostate: Secondary | ICD-10-CM

## 2017-10-17 DIAGNOSIS — R0982 Postnasal drip: Secondary | ICD-10-CM | POA: Diagnosis not present

## 2017-10-17 DIAGNOSIS — Z0000 Encounter for general adult medical examination without abnormal findings: Secondary | ICD-10-CM

## 2017-10-17 DIAGNOSIS — Z13228 Encounter for screening for other metabolic disorders: Secondary | ICD-10-CM

## 2017-10-17 DIAGNOSIS — R7303 Prediabetes: Secondary | ICD-10-CM

## 2017-10-17 DIAGNOSIS — R1310 Dysphagia, unspecified: Secondary | ICD-10-CM | POA: Diagnosis not present

## 2017-10-17 MED ORDER — FLUTICASONE PROPIONATE 50 MCG/ACT NA SUSP: 1.0000 | Freq: Two times a day (BID) | NASAL | 6 refills | Status: DC

## 2017-10-17 NOTE — Progress Notes (Signed)
6/20/201911:42 AM  Devon Martinez 1970/10/26, 47 y.o. male [id number]  Chief Complaint  Patient presents with  . Annual Exam    pt states "I had a positive TB skin test last year", at age 64 treated for TB for about 6-12 months.    HPI:   Patient is a 47 y.o. male who presents today for CPE as required by insurance  Last CPE years ago He works as a Passenger transport manager with [employer] HIV test in 2013 Single, uses condoms, sex with women, had a vasectomy Does not smoke, drink etoh or use illicit drugs He exercises regularly, tries to follow a healthy diet He sees dentist and ophtho regularly   Fall Risk  10/17/2017 05/13/2017  Falls in the past year? No No     Depression screen [facility] 2/9 10/17/2017 05/13/2017  Decreased Interest 0 0  Down, Depressed, Hopeless 0 0  PHQ - 2 Score 0 0    No Known Allergies  Prior to Admission medications   Medication Sig Start Date End Date Taking? Authorizing Provider  ibuprofen (ADVIL,MOTRIN) 800 MG tablet Take 0.5 tablets (400 mg total) by mouth every 8 (eight) hours as needed for pain or fever. 05/22/11  Yes Barton Dubois, MD    Past Medical History:  Diagnosis Date  . Cellulitis of neck 05/21/11   anterior  . H/O TB (tuberculosis)    at age 81, treated by [facility]    Past Surgical History:  Procedure Laterality Date  . APPENDECTOMY    . MYRINGOTOMY  ~ 1987   bilaterally  . VASECTOMY      Social History   Tobacco Use  . Smoking status: Never Smoker  . Smokeless tobacco: Never Used  Substance Use Topics  . Alcohol use: No    Family History  Problem Relation Age of Onset  . Hypertension Mother   . Cancer Mother        Cervical  . Heart disease Mother   . Hypertension Father   . Cancer Father   . Heart disease Father   . Healthy Sister   . Cancer Sister        Breast  . Healthy Brother   many family members have died due to cancer, unknown if any have been GI or colon.  Review of Systems    Constitutional: Negative.  Negative for malaise/fatigue and weight loss.  HENT: Positive for congestion, ear pain (ear pressure), hearing loss (muffled hearing) and tinnitus (intermittent).        Endorses dysphagia, feels food gets stuck at the end of throat  Eyes: Positive for photophobia. Negative for blurred vision, double vision and pain.  Respiratory: Positive for cough. Negative for hemoptysis, sputum production, shortness of breath and wheezing.   Cardiovascular: Negative.   Gastrointestinal: Positive for blood in stool and heartburn. Negative for melena, nausea and vomiting.  Genitourinary: Negative.   Musculoskeletal: Positive for back pain and neck pain.  Skin: Negative.   Neurological: Positive for tingling (BUE, intermittent, worse with sleep). Negative for dizziness and headaches.  Endo/Heme/Allergies: Negative.   Psychiatric/Behavioral: Negative.   All other systems reviewed and are negative.    OBJECTIVE:  Blood pressure 128/90, pulse 68, temperature 98.8 F (37.1 C), temperature source Oral, resp. rate 16, height 5' 11.5" (1.816 m), weight 184 lb 12.8 oz (83.8 kg), SpO2 98 %.  Physical Exam  Constitutional: He is oriented to person, place, and time. He appears well-developed and well-nourished.  HENT:  Head: Normocephalic  and atraumatic.  Right Ear: Hearing, tympanic membrane, external ear and ear canal normal.  Left Ear: Hearing, tympanic membrane, external ear and ear canal normal.  Mouth/Throat: Oropharynx is clear and moist. No oropharyngeal exudate.  Eyes: Pupils are equal, round, and reactive to light. Conjunctivae and EOM are normal.  Neck: Neck supple. No thyromegaly present.  Cardiovascular: Normal rate, regular rhythm, normal heart sounds and intact distal pulses. Exam reveals no gallop and no friction rub.  No murmur heard. Pulmonary/Chest: Effort normal and breath sounds normal. He has no wheezes. He has no rhonchi. He has no rales.  Abdominal: Soft.  Bowel sounds are normal. He exhibits no distension and no mass. There is no tenderness.  Musculoskeletal: Normal range of motion. He exhibits no edema.  Lymphadenopathy:    He has no cervical adenopathy.       Right: No supraclavicular adenopathy present.       Left: No supraclavicular adenopathy present.  Neurological: He is alert and oriented to person, place, and time. He has normal strength and normal reflexes. No cranial nerve deficit. Coordination and gait normal.  Skin: Skin is warm and dry.  Psychiatric: He has a normal mood and affect.  Nursing note and vitals reviewed.   ASSESSMENT and PLAN  1. Annual physical exam Routine HCM labs ordered. HCM reviewed/discussed. Anticipatory guidance regarding healthy weight, lifestyle and choices given.   2. Screening for metabolic disorder - YEB34+DHWY - Hemoglobin A1c  3. Screening for lipid disorders - Lipid panel  4. Screening for thyroid disorder - TSH  5. Screening for deficiency anemia - CBC with Differential/Platelet  6. Screening for prostate cancer - PSA  7. Dysphagia, unspecified type 8. Postnasal drip Will start with treating PND. Discussed if no improvement in sx to start an H2B or PPI at bedtime. Consider swallow study - fluticasone (FLONASE) 50 MCG/ACT nasal spray; Place 1 spray into both nostrils 2 (two) times daily.  9. Bright red blood per rectum - Ambulatory referral to Gastroenterology  10. Family history of cancer - Ambulatory referral to Gastroenterology   Return for after GI.    Rutherford Guys, MD Primary Care at [facility] [place], [place] [address] Ph.  [phone] Fax [phone]

## 2017-10-17 NOTE — Patient Instructions (Addendum)
   IF you received an x-ray today, you will receive an invoice from [facility]. Please contact [facility] at [phone] with questions or concerns regarding your invoice.   IF you received labwork today, you will receive an invoice from [facility]. Please contact [facility] at [phone] with questions or concerns regarding your invoice.   Our billing staff will not be able to assist you with questions regarding bills from these companies.  You will be contacted with the lab results as soon as they are available. The fastest way to get your results is to activate your My Chart account. Instructions are located on the last page of this paperwork. If you have not heard from us regarding the results in 2 weeks, please contact this office.     Preventive Care 40-64 Years, Male Preventive care refers to lifestyle choices and visits with your health care provider that can promote health and wellness. What does preventive care include?  A yearly physical exam. This is also called an annual well check.  Dental exams once or twice a year.  Routine eye exams. Ask your health care provider how often you should have your eyes checked.  Personal lifestyle choices, including: ? Daily care of your teeth and gums. ? Regular physical activity. ? Eating a healthy diet. ? Avoiding tobacco and drug use. ? Limiting alcohol use. ? Practicing safe sex. ? Taking low-dose aspirin every day starting at age 50. What happens during an annual well check? The services and screenings done by your health care provider during your annual well check will depend on your age, overall health, lifestyle risk factors, and family history of disease. Counseling Your health care provider may ask you questions about your:  Alcohol use.  Tobacco use.  Drug use.  Emotional well-being.  Home and relationship well-being.  Sexual activity.  Eating habits.  Work and work  environment.  Screening You may have the following tests or measurements:  Height, weight, and BMI.  Blood pressure.  Lipid and cholesterol levels. These may be checked every 5 years, or more frequently if you are over 50 years old.  Skin check.  Lung cancer screening. You may have this screening every year starting at age 55 if you have a 30-pack-year history of smoking and currently smoke or have quit within the past 15 years.  Fecal occult blood test (FOBT) of the stool. You may have this test every year starting at age 50.  Flexible sigmoidoscopy or colonoscopy. You may have a sigmoidoscopy every 5 years or a colonoscopy every 10 years starting at age 50.  Prostate cancer screening. Recommendations will vary depending on your family history and other risks.  Hepatitis C blood test.  Hepatitis B blood test.  Sexually transmitted disease (STD) testing.  Diabetes screening. This is done by checking your blood sugar (glucose) after you have not eaten for a while (fasting). You may have this done every 1-3 years.  Discuss your test results, treatment options, and if necessary, the need for more tests with your health care provider. Vaccines Your health care provider may recommend certain vaccines, such as:  Influenza vaccine. This is recommended every year.  Tetanus, diphtheria, and acellular pertussis (Tdap, Td) vaccine. You may need a Td booster every 10 years.  Varicella vaccine. You may need this if you have not been vaccinated.  Zoster vaccine. You may need this after age 60.  Measles, mumps, and rubella (MMR) vaccine. You may need at least one dose of   MMR if you were born in 1957 or later. You may also need a second dose.  Pneumococcal 13-valent conjugate (PCV13) vaccine. You may need this if you have certain conditions and have not been vaccinated.  Pneumococcal polysaccharide (PPSV23) vaccine. You may need one or two doses if you smoke cigarettes or if you have  certain conditions.  Meningococcal vaccine. You may need this if you have certain conditions.  Hepatitis A vaccine. You may need this if you have certain conditions or if you travel or work in places where you may be exposed to hepatitis A.  Hepatitis B vaccine. You may need this if you have certain conditions or if you travel or work in places where you may be exposed to hepatitis B.  Haemophilus influenzae type b (Hib) vaccine. You may need this if you have certain risk factors.  Talk to your health care provider about which screenings and vaccines you need and how often you need them. This information is not intended to replace advice given to you by your health care provider. Make sure you discuss any questions you have with your health care provider. Document Released: 05/13/2015 Document Revised: 01/04/2016 Document Reviewed: 02/15/2015 Elsevier Interactive Patient Education  2018 Elsevier Inc.  

## 2017-10-18 LAB — CBC WITH DIFFERENTIAL/PLATELET
Basophils Absolute: 0 10*3/uL (ref 0.0–0.2)
Basos: 1 %
EOS (ABSOLUTE): 0.1 10*3/uL (ref 0.0–0.4)
Eos: 1 %
Hematocrit: 40.3 % (ref 37.5–51.0)
Hemoglobin: 13.3 g/dL (ref 13.0–17.7)
Immature Grans (Abs): 0 10*3/uL (ref 0.0–0.1)
Immature Granulocytes: 0 %
Lymphocytes Absolute: 3.1 10*3/uL (ref 0.7–3.1)
Lymphs: 48 %
MCH: 29.4 pg (ref 26.6–33.0)
MCHC: 33 g/dL (ref 31.5–35.7)
MCV: 89 fL (ref 79–97)
Monocytes Absolute: 0.5 10*3/uL (ref 0.1–0.9)
Monocytes: 7 %
Neutrophils Absolute: 2.8 10*3/uL (ref 1.4–7.0)
Neutrophils: 43 %
Platelets: 230 10*3/uL (ref 150–450)
RBC: 4.53 x10E6/uL (ref 4.14–5.80)
RDW: 14.1 % (ref 12.3–15.4)
WBC: 6.4 10*3/uL (ref 3.4–10.8)

## 2017-10-18 LAB — TSH: TSH: 1.47 u[IU]/mL (ref 0.450–4.500)

## 2017-10-18 LAB — LIPID PANEL
Chol/HDL Ratio: 3.5 ratio (ref 0.0–5.0)
Cholesterol, Total: 180 mg/dL (ref 100–199)
HDL: 52 mg/dL (ref >39)
LDL Calculated: 101 mg/dL — ABNORMAL HIGH (ref 0–99)
Triglycerides: 133 mg/dL (ref 0–149)
VLDL Cholesterol Cal: 27 mg/dL (ref 5–40)

## 2017-10-18 LAB — CMP14+EGFR
ALT: 32 IU/L (ref 0–44)
AST: 26 IU/L (ref 0–40)
Albumin/Globulin Ratio: 1.8 (ref 1.2–2.2)
Albumin: 4.6 g/dL (ref 3.5–5.5)
Alkaline Phosphatase: 54 IU/L (ref 39–117)
BUN/Creatinine Ratio: 9 (ref 9–20)
BUN: 9 mg/dL (ref 6–24)
Bilirubin Total: 0.3 mg/dL (ref 0.0–1.2)
CO2: 27 mmol/L (ref 20–29)
Calcium: 9.7 mg/dL (ref 8.7–10.2)
Chloride: 101 mmol/L (ref 96–106)
Creatinine, Ser: 1.02 mg/dL (ref 0.76–1.27)
GFR calc Af Amer: 101 mL/min/{1.73_m2} (ref >59)
GFR calc non Af Amer: 88 mL/min/{1.73_m2} (ref >59)
Globulin, Total: 2.5 g/dL (ref 1.5–4.5)
Glucose: 93 mg/dL (ref 65–99)
Potassium: 4.9 mmol/L (ref 3.5–5.2)
Sodium: 144 mmol/L (ref 134–144)
Total Protein: 7.1 g/dL (ref 6.0–8.5)

## 2017-10-18 LAB — PSA: Prostate Specific Ag, Serum: 0.7 ng/mL (ref 0.0–4.0)

## 2017-10-18 LAB — HEMOGLOBIN A1C
Est. average glucose Bld gHb Est-mCnc: 120 mg/dL
Hgb A1c MFr Bld: 5.8 % — ABNORMAL HIGH (ref 4.8–5.6)

## 2017-10-21 ENCOUNTER — Encounter: Payer: Self-pay | Admitting: Family Medicine

## 2017-11-11 ENCOUNTER — Encounter: Payer: Self-pay | Admitting: Family Medicine

## 2017-11-18 ENCOUNTER — Encounter: Payer: Self-pay | Admitting: Physician Assistant

## 2017-12-05 ENCOUNTER — Ambulatory Visit: Payer: Self-pay | Admitting: Physician Assistant

## 2017-12-09 ENCOUNTER — Telehealth: Payer: Self-pay | Admitting: Family Medicine

## 2017-12-09 NOTE — Telephone Encounter (Signed)
Called pt and left a message on vm to call [facility] to reschedule a "no show" appointment pt had on 12/05/17 with them.

## 2018-04-07 ENCOUNTER — Ambulatory Visit: Payer: BLUE CROSS/BLUE SHIELD | Admitting: Family Medicine

## 2018-06-02 DIAGNOSIS — M67372 Transient synovitis, left ankle and foot: Secondary | ICD-10-CM | POA: Diagnosis not present

## 2018-06-02 DIAGNOSIS — M25775 Osteophyte, left foot: Secondary | ICD-10-CM | POA: Diagnosis not present

## 2018-06-24 DIAGNOSIS — R079 Chest pain, unspecified: Secondary | ICD-10-CM | POA: Diagnosis not present

## 2018-06-24 DIAGNOSIS — R42 Dizziness and giddiness: Secondary | ICD-10-CM | POA: Diagnosis not present

## 2018-06-24 DIAGNOSIS — R05 Cough: Secondary | ICD-10-CM | POA: Diagnosis not present

## 2018-06-24 DIAGNOSIS — R002 Palpitations: Secondary | ICD-10-CM | POA: Diagnosis not present

## 2018-06-25 ENCOUNTER — Encounter: Payer: BLUE CROSS/BLUE SHIELD | Admitting: Podiatry

## 2018-06-27 ENCOUNTER — Other Ambulatory Visit: Payer: Self-pay | Admitting: Family Medicine

## 2018-06-27 DIAGNOSIS — R42 Dizziness and giddiness: Secondary | ICD-10-CM

## 2018-06-27 DIAGNOSIS — R079 Chest pain, unspecified: Secondary | ICD-10-CM

## 2018-06-30 ENCOUNTER — Ambulatory Visit: Payer: BLUE CROSS/BLUE SHIELD

## 2018-06-30 DIAGNOSIS — R42 Dizziness and giddiness: Secondary | ICD-10-CM

## 2018-06-30 DIAGNOSIS — R0789 Other chest pain: Secondary | ICD-10-CM | POA: Diagnosis not present

## 2018-06-30 DIAGNOSIS — R079 Chest pain, unspecified: Secondary | ICD-10-CM

## 2018-07-01 NOTE — Progress Notes (Signed)
This encounter was created in error - please disregard.

## 2018-07-07 NOTE — Progress Notes (Signed)
Subjective:    ID@: Devon Martinez, male    DOB: 04-10-1971, 48 y.o.   MRN: [id number]  Chief Complaint  Patient presents with  . Chest Pain  . Palpitations     Patient referred by [name] for chest pain and palpitations.  HPI:   Patient without past medical history recently seen by his PCP for chest pain and heart racing. Due to concerning symptoms PCP had arranged for [name] to perform GXT that was done on 06/30/2018 and considered intermediate risk stress test due to activity limiting chest pain, but no EKG changes suggestive of ischemia.   He reports symptoms began a few weeks ago. States that he would have episodes of chest discomfort at rest that he reports made it hard to take a deep breath. Episodes would last for 3-5 minutes. Not associated with pain radiation or with exertion. He has not had any episodes for the last 1 week. Does have occasional dizziness with exertion.  His main complaint today, is episodes of heart racing that occur while resting or our of proportion for his activity. Describes as irregular heart beats and pounding heart beat. Symptoms suddenly start and gradually resolve.   Reports was seen in [place] several years ago and was told to have enlarged heart by echocardiogram. He reports he was given a prescription for 2 "heart medications" that he states he did not understand why he needed and did not take them. Denies any history of hypertension or hyperlipidemia. No former tobacco use.    Past Medical History:  Diagnosis Date  . Cellulitis of neck 05/21/11   anterior  . H/O TB (tuberculosis)    at age 41, treated by [facility]  . Prediabetes 09/2017   hgb a1c 5.8    Past Surgical History:  Procedure Laterality Date  . APPENDECTOMY    . MYRINGOTOMY  ~ 1987   bilaterally  . VASECTOMY      Family History  Problem Relation Age of Onset  . Hypertension Mother   . Cancer Mother        Cervical  . Heart disease Mother   . Heart attack  Mother   . Hypertension Father   . Cancer Father   . Heart disease Father   . Healthy Sister   . Cancer Sister        Breast  . Healthy Brother   . Cancer Brother     Social History   Socioeconomic History  . Marital status: Single    Spouse name: Not on file  . Number of children: 5  . Years of education: Not on file  . Highest education level: Not on file  Occupational History  . Not on file  Social Needs  . Financial resource strain: Not on file  . Food insecurity:    Worry: Not on file    Inability: Not on file  . Transportation needs:    Medical: Not on file    Non-medical: Not on file  Tobacco Use  . Smoking status: Never Smoker  . Smokeless tobacco: Never Used  Substance and Sexual Activity  . Alcohol use: No  . Drug use: No  . Sexual activity: Yes  Lifestyle  . Physical activity:    Days per week: Not on file    Minutes per session: Not on file  . Stress: Not on file  Relationships  . Social connections:    Talks on phone: Not on file    Gets  together: Not on file    Attends religious service: Not on file    Active member of club or organization: Not on file    Attends meetings of clubs or organizations: Not on file    Relationship status: Not on file  . Intimate partner violence:    Fear of current or ex partner: Not on file    Emotionally abused: Not on file    Physically abused: Not on file    Forced sexual activity: Not on file  Other Topics Concern  . Not on file  Social History Narrative  . Not on file    Current Outpatient Medications on File Prior to Visit  Medication Sig Dispense Refill  . ibuprofen (ADVIL,MOTRIN) 800 MG tablet Take 0.5 tablets (400 mg total) by mouth every 8 (eight) hours as needed for pain or fever. 30 tablet   . fluticasone (FLONASE) 50 MCG/ACT nasal spray Place 1 spray into both nostrils 2 (two) times daily. (Patient not taking: Reported on 07/09/2018) 16 g 6   No current facility-administered medications on file  prior to visit.      Review of Systems  Constitution: Negative for decreased appetite, malaise/fatigue, weight gain and weight loss.  Eyes: Negative for visual disturbance.  Cardiovascular: Positive for chest pain (at rest; lasting 3-5 mins) and palpitations. Negative for claudication, dyspnea on exertion, leg swelling, orthopnea and syncope.  Respiratory: Negative for hemoptysis and wheezing.   Endocrine: Negative for cold intolerance and heat intolerance.  Hematologic/Lymphatic: Does not bruise/bleed easily.  Skin: Negative for nail changes.  Musculoskeletal: Negative for muscle weakness and myalgias.  Gastrointestinal: Negative for abdominal pain, change in bowel habit, nausea and vomiting.  Neurological: Positive for light-headedness (exertional). Negative for difficulty with concentration, dizziness, focal weakness and headaches.  Psychiatric/Behavioral: Negative for altered mental status and suicidal ideas.  All other systems reviewed and are negative.  Blood pressure 107/74, pulse 87, height 5\' 11"  (1.803 m), weight 189 lb 12.8 oz (86.1 kg), SpO2 97 %.       Objective:   Physical Exam  Constitutional: He appears well-developed and well-nourished. No distress.  HENT:  Head: Atraumatic.  Eyes: Conjunctivae are normal.  Neck: Neck supple. No JVD present. No thyromegaly present.  Cardiovascular: Normal rate, regular rhythm, normal heart sounds and intact distal pulses. Exam reveals no gallop.  No murmur heard. Pulmonary/Chest: Effort normal and breath sounds normal.  Abdominal: Soft. Bowel sounds are normal.  Musculoskeletal: Normal range of motion.        General: No edema.  Neurological: He is alert.  Skin: Skin is warm and dry.  Psychiatric: He has a normal mood and affect.  Vitals reviewed.   Cardiac studies:   EKG 07/09/2018: Normal sinus rhythm at 80 bpm, normal axis, no evidence of ischemia. Normal EKG  Exercise treadmill stress test 06/30/2018: Indication:  Palpitation The patient exercised on Bruce protocol for  10:12 min. Patient achieved  11.92 METS and reached HR  179 bpm, which is  102 % of maximum age-predicted HR.  Stress test terminated due to tightness in chest.  Exercise capacity was normal. HR Response to Exercise: Appropriate. BP Response to Exercise: Normal resting BP- appropriate response. Chest Pain: limiting. Arrhythmias: none. Resting EKG demonstrates Normal sinus rhythm. ST Changes: With peak exercise, there were <1 upsloping ST depressions in inferolateral leads. These changes do not suggest ischemia. Overall Impression:  Intermediate risk stress test due to limiting chest pain symptoms without ischemic EKG changes.  Recommendations:  Consider further  cardiac workup, if clinically indicated.  Chest X-Ray 2018: Normal.   Recent Labs:  CBC Latest Ref Rng & Units 10/17/2017  WBC 3.4 - 10.8 x10E3/uL 6.4  Hemoglobin 13.0 - 17.7 g/dL 50.5  Hematocrit 39.7 - 51.0 % 40.3  Platelets 150 - 450 x10E3/uL 230   Lipid Panel 10/17/2017: Component Value   CHOL 180   TRIG 133   HDL 52   CHOLHDL 3.5   LDLCALC 101 (H)   CMP Latest Ref Rng & Units 10/17/2017  Glucose 65 - 99 mg/dL 93  BUN 6 - 24 mg/dL 9  Creatinine 6.73 - 4.19 mg/dL 3.79  Sodium 024 - 097 mmol/L 144  Potassium 3.5 - 5.2 mmol/L 4.9  Chloride 96 - 106 mmol/L 101  CO2 20 - 29 mmol/L 27  Calcium 8.7 - 10.2 mg/dL 9.7  Total Protein 6.0 - 8.5 g/dL 7.1  Total Bilirubin 0.0 - 1.2 mg/dL 0.3  Alkaline Phos 39 - 117 IU/L 54  AST 0 - 40 IU/L 26  ALT 0 - 44 IU/L 32        Assessment & Recommendations:   1. Atypical chest pain Has not had any episodes over the last 1 week. He did have exertional chest pain during stress test, but was without EKG changes. Other episodes of chest pain are suggestive of atypical chest pain. Will consider further evaluation with nuclear stress testing. Will obtain echocardiogram first for further evaluation of any structural abnormalities.  Physical exam and EKG are unremarkable.    2. Palpitations Reports episodes of heart pounding and irregular beats at rest. Will place him on 48 hour holter monitor for further evaluation.  3. Prediabetes Controlling with diet and being followed by PCP.   4. Family history of early CAD Reports significant history for CAD. The 10-year ASCVD risk score Denman George DC Montez Hageman., et al., 2013) is: 3%   Values used to calculate the score:     Age: 57 years     Sex: Male     Is Non-Hispanic African American: Yes     Diabetic: No     Tobacco smoker: No     Systolic Blood Pressure: 107 mmHg     Is BP treated: No     HDL Cholesterol: 52 mg/dL     Total Cholesterol: 180 mg/dL  Plan: I will see him back after the test for further evaluation and recommendations.     Thank you for referring this patient. Please do not hesitate to contact me for any questions.   Altamese Kensington, MSN, APRN, FNP-C [facility] Office: [phone] Fax: [phone]

## 2018-07-09 ENCOUNTER — Other Ambulatory Visit: Payer: Self-pay

## 2018-07-09 ENCOUNTER — Encounter: Payer: Self-pay | Admitting: Cardiology

## 2018-07-09 ENCOUNTER — Ambulatory Visit: Payer: BLUE CROSS/BLUE SHIELD | Admitting: Cardiology

## 2018-07-09 VITALS — BP 107/74 | HR 87 | Ht 71.0 in | Wt 189.8 lb

## 2018-07-09 DIAGNOSIS — R0789 Other chest pain: Secondary | ICD-10-CM | POA: Diagnosis not present

## 2018-07-09 DIAGNOSIS — R7303 Prediabetes: Secondary | ICD-10-CM | POA: Diagnosis not present

## 2018-07-09 DIAGNOSIS — Z8249 Family history of ischemic heart disease and other diseases of the circulatory system: Secondary | ICD-10-CM | POA: Diagnosis not present

## 2018-07-09 DIAGNOSIS — R002 Palpitations: Secondary | ICD-10-CM

## 2018-07-10 ENCOUNTER — Ambulatory Visit: Payer: BLUE CROSS/BLUE SHIELD

## 2018-07-10 DIAGNOSIS — R0789 Other chest pain: Secondary | ICD-10-CM | POA: Insufficient documentation

## 2018-07-10 DIAGNOSIS — R002 Palpitations: Secondary | ICD-10-CM | POA: Insufficient documentation

## 2018-07-10 DIAGNOSIS — Z8249 Family history of ischemic heart disease and other diseases of the circulatory system: Secondary | ICD-10-CM | POA: Insufficient documentation

## 2018-07-14 DIAGNOSIS — R002 Palpitations: Secondary | ICD-10-CM | POA: Diagnosis not present

## 2018-08-07 ENCOUNTER — Ambulatory Visit: Payer: BLUE CROSS/BLUE SHIELD | Admitting: Cardiology

## 2018-08-07 ENCOUNTER — Other Ambulatory Visit: Payer: Self-pay

## 2018-08-07 ENCOUNTER — Telehealth: Payer: Self-pay

## 2018-08-07 NOTE — Progress Notes (Unsigned)
Subjective:   @Patient  ID@: Devon Martinez, male    DOB: [birth year], 48 y.o.   MRN: [id number]  No chief complaint on file.    Patient referred by Irena Reichmannollins, Dana for chest pain and palpitations.  HPI:   Patient without past medical history recently seen by his PCP for chest pain and heart racing. Due to concerning symptoms PCP had arranged for us to perform GXT that was done on 06/30/2018 and considered intermediate risk stress test due to activity limiting chest pain, but no EKG changes suggestive of ischemia.   He reports symptoms began a few weeks ago. States that he would have episodes of chest discomfort at rest that he reports made it hard to take a deep breath. Episodes would last for 3-5 minutes. Not associated with pain radiation or with exertion. He has not had any episodes for the last 1 week. Does have occasional dizziness with exertion.  His main complaint today, is episodes of heart racing that occur while resting or our of proportion for his activity. Describes as irregular heart beats and pounding heart beat. Symptoms suddenly start and gradually resolve.   Reports was seen in [place] several years ago and was told to have enlarged heart by echocardiogram. He reports he was given a prescription for 2 "heart medications" that he states he did not understand why he needed and did not take them. Denies any history of hypertension or hyperlipidemia. No former tobacco use.    Past Medical History:  Diagnosis Date  . Cellulitis of neck 05/21/11   anterior  . H/O TB (tuberculosis)    [facility] age 48[facility]  . Prediabetes 09/2017   hgb a1c 5.8    Past Surgical History:  Procedure Laterality Date  . APPENDECTOMY    . MYRINGOTOMY  ~ 1987   bilaterally  . VASECTOMY      Family History  Problem Relation Age of Onset  . Hypertension Mother   . Cancer Mother        Cervical  . Heart disease Mother   . Heart attack Mother   . Hypertension Father   . Cancer  Father   . Heart disease Father   . Healthy Sister   . Cancer Sister        Breast  . Healthy Brother   . Cancer Brother     Social History   Socioeconomic History  . Marital status: Single    Spouse name: Not on file  . Number of children: 5  . Years of education: Not on file  . Highest education level: Not on file  Occupational History  . Not on file  Social Needs  . Financial resource strain: Not on file  . Food insecurity:    Worry: Not on file    Inability: Not on file  . Transportation needs:    Medical: Not on file    Non-medical: Not on file  Tobacco Use  . Smoking status: Never Smoker  . Smokeless tobacco: Never Used  Substance and Sexual Activity  . Alcohol use: No  . Drug use: No  . Sexual activity: Yes  Lifestyle  . Physical activity:    Days per week: Not on file    Minutes per session: Not on file  . Stress: Not on file  Relationships  . Social connections:    Talks on phone: Not on file    Gets together: Not on file    Attends religious  service: Not on file    Active member of club or organization: Not on file    Attends meetings of clubs or organizations: Not on file    Relationship status: Not on file  . Intimate partner violence:    Fear of current or ex partner: Not on file    Emotionally abused: Not on file    Physically abused: Not on file    Forced sexual activity: Not on file  Other Topics Concern  . Not on file  Social History Narrative  . Not on file    Current Outpatient Medications on File Prior to Visit  Medication Sig Dispense Refill  . fluticasone (FLONASE) 50 MCG/ACT nasal spray Place 1 spray into both nostrils 2 (two) times daily. (Patient not taking: Reported on 07/09/2018) 16 g 6  . ibuprofen (ADVIL,MOTRIN) 800 MG tablet Take 0.5 tablets (400 mg total) by mouth every 8 (eight) hours as needed for pain or fever. 30 tablet    No current facility-administered medications on file prior to visit.      Review of Systems   Constitution: Negative for decreased appetite, malaise/fatigue, weight gain and weight loss.  Eyes: Negative for visual disturbance.  Cardiovascular: Positive for chest pain (at rest; lasting 3-5 mins) and palpitations. Negative for claudication, dyspnea on exertion, leg swelling, orthopnea and syncope.  Respiratory: Negative for hemoptysis and wheezing.   Endocrine: Negative for cold intolerance and heat intolerance.  Hematologic/Lymphatic: Does not bruise/bleed easily.  Skin: Negative for nail changes.  Musculoskeletal: Negative for muscle weakness and myalgias.  Gastrointestinal: Negative for abdominal pain, change in bowel habit, nausea and vomiting.  Neurological: Positive for light-headedness (exertional). Negative for difficulty with concentration, dizziness, focal weakness and headaches.  Psychiatric/Behavioral: Negative for altered mental status and suicidal ideas.  All other systems reviewed and are negative.  There were no vitals taken for this visit.       Objective:   Physical Exam  Constitutional: He appears well-developed and well-nourished. No distress.  HENT:  Head: Atraumatic.  Eyes: Conjunctivae are normal.  Neck: Neck supple. No JVD present. No thyromegaly present.  Cardiovascular: Normal rate, regular rhythm, normal heart sounds and intact distal pulses. Exam reveals no gallop.  No murmur heard. Pulmonary/Chest: Effort normal and breath sounds normal.  Abdominal: Soft. Bowel sounds are normal.  Musculoskeletal: Normal range of motion.        General: No edema.  Neurological: He is alert.  Skin: Skin is warm and dry.  Psychiatric: He has a normal mood and affect.  Vitals reviewed.   Cardiac studies:   EKG 07/09/2018: Normal sinus rhythm at 80 bpm, normal axis, no evidence of ischemia. Normal EKG  48 hr holter monitor 07/22/2018: Sinus rhythm with rare PAC and PVc. Symptoms of palpitations on day 1 correlated with sinus tachycardia at 108 bpm. No A fib or  SVT was noted.  Exercise treadmill stress test 06/30/2018: Indication: Palpitation The patient exercised on Bruce protocol for  10:12 min. Patient achieved  11.92 METS and reached HR  179 bpm, which is  102 % of maximum age-predicted HR.  Stress test terminated due to tightness in chest.  Exercise capacity was normal. HR Response to Exercise: Appropriate. BP Response to Exercise: Normal resting BP- appropriate response. Chest Pain: limiting. Arrhythmias: none. Resting EKG demonstrates Normal sinus rhythm. ST Changes: With peak exercise, there were <1 upsloping ST depressions in inferolateral leads. These changes do not suggest ischemia. Overall Impression:  Intermediate risk stress test due to limiting  chest pain symptoms without ischemic EKG changes.  Recommendations:  Consider further cardiac workup, if clinically indicated.  Chest X-Ray 2018: Normal.   Recent Labs:  CBC Latest Ref Rng & Units 10/17/2017  WBC 3.4 - 10.8 x10E3/uL 6.4  Hemoglobin 13.0 - 17.7 g/dL 16.1  Hematocrit 09.6 - 51.0 % 40.3  Platelets 150 - 450 x10E3/uL 230   Lipid Panel 10/17/2017: Component Value   CHOL 180   TRIG 133   HDL 52   CHOLHDL 3.5   LDLCALC 101 (H)   CMP Latest Ref Rng & Units 10/17/2017  Glucose 65 - 99 mg/dL 93  BUN 6 - 24 mg/dL 9  Creatinine 0.45 - 4.09 mg/dL 8.11  Sodium 914 - 782 mmol/L 144  Potassium 3.5 - 5.2 mmol/L 4.9  Chloride 96 - 106 mmol/L 101  CO2 20 - 29 mmol/L 27  Calcium 8.7 - 10.2 mg/dL 9.7  Total Protein 6.0 - 8.5 g/dL 7.1  Total Bilirubin 0.0 - 1.2 mg/dL 0.3  Alkaline Phos 39 - 117 IU/L 54  AST 0 - 40 IU/L 26  ALT 0 - 44 IU/L 32        Assessment & Recommendations:   1. Atypical chest pain Has not had any episodes over the last 1 week. He did have exertional chest pain during stress test, but was without EKG changes. Other episodes of chest pain are suggestive of atypical chest pain. Will consider further evaluation with nuclear stress testing. Will obtain  echocardiogram first for further evaluation of any structural abnormalities. Physical exam and EKG are unremarkable.    2. Palpitations Reports episodes of heart pounding and irregular beats at rest. Will place him on 48 hour holter monitor for further evaluation.  3. Prediabetes Controlling with diet and being followed by PCP.   4. Family history of early CAD Reports significant history for CAD. The 10-year ASCVD risk score Denman George DC Montez Hageman., et al., 2013) is: 3%   Values used to calculate the score:     Age: 48 years     Sex: Male     Is Non-Hispanic African American: Yes     Diabetic: No     Tobacco smoker: No     Systolic Blood Pressure: 107 mmHg     Is BP treated: No     HDL Cholesterol: 52 mg/dL     Total Cholesterol: 180 mg/dL  Plan: I will see him back after the test for further evaluation and recommendations.     Thank you for referring this patient. Please do not hesitate to contact me for any questions.   Altamese Belleville, MSN, APRN, FNP-C [facility] Office: [phone] Fax: [phone]

## 2018-08-15 NOTE — Telephone Encounter (Signed)
Called pt to confirm his 10am appt for today, to see if he can do a Virtual visit or needs to reschedule

## 2018-10-29 DIAGNOSIS — R7309 Other abnormal glucose: Secondary | ICD-10-CM | POA: Diagnosis not present

## 2018-11-14 DIAGNOSIS — M79671 Pain in right foot: Secondary | ICD-10-CM | POA: Diagnosis not present

## 2018-11-18 DIAGNOSIS — Z0000 Encounter for general adult medical examination without abnormal findings: Secondary | ICD-10-CM | POA: Diagnosis not present

## 2018-11-18 DIAGNOSIS — Z1211 Encounter for screening for malignant neoplasm of colon: Secondary | ICD-10-CM | POA: Diagnosis not present

## 2018-11-18 DIAGNOSIS — M1990 Unspecified osteoarthritis, unspecified site: Secondary | ICD-10-CM | POA: Diagnosis not present

## 2018-11-18 DIAGNOSIS — R7303 Prediabetes: Secondary | ICD-10-CM | POA: Diagnosis not present

## 2018-11-20 DIAGNOSIS — Z0000 Encounter for general adult medical examination without abnormal findings: Secondary | ICD-10-CM | POA: Diagnosis not present

## 2018-11-27 DIAGNOSIS — R7309 Other abnormal glucose: Secondary | ICD-10-CM | POA: Diagnosis not present

## 2018-11-27 DIAGNOSIS — Z0000 Encounter for general adult medical examination without abnormal findings: Secondary | ICD-10-CM | POA: Diagnosis not present

## 2018-12-04 DIAGNOSIS — Z1211 Encounter for screening for malignant neoplasm of colon: Secondary | ICD-10-CM | POA: Diagnosis not present

## 2018-12-04 DIAGNOSIS — K625 Hemorrhage of anus and rectum: Secondary | ICD-10-CM | POA: Diagnosis not present

## 2018-12-04 DIAGNOSIS — R140 Abdominal distension (gaseous): Secondary | ICD-10-CM | POA: Diagnosis not present

## 2018-12-04 DIAGNOSIS — Z800 Family history of malignant neoplasm of digestive organs: Secondary | ICD-10-CM | POA: Diagnosis not present

## 2018-12-08 DIAGNOSIS — Z1211 Encounter for screening for malignant neoplasm of colon: Secondary | ICD-10-CM | POA: Diagnosis not present

## 2018-12-08 DIAGNOSIS — Z800 Family history of malignant neoplasm of digestive organs: Secondary | ICD-10-CM | POA: Diagnosis not present

## 2018-12-08 DIAGNOSIS — K635 Polyp of colon: Secondary | ICD-10-CM | POA: Diagnosis not present

## 2018-12-08 DIAGNOSIS — D124 Benign neoplasm of descending colon: Secondary | ICD-10-CM | POA: Diagnosis not present

## 2018-12-08 DIAGNOSIS — K625 Hemorrhage of anus and rectum: Secondary | ICD-10-CM | POA: Diagnosis not present

## 2018-12-08 DIAGNOSIS — K3189 Other diseases of stomach and duodenum: Secondary | ICD-10-CM | POA: Diagnosis not present

## 2018-12-08 DIAGNOSIS — K2970 Gastritis, unspecified, without bleeding: Secondary | ICD-10-CM | POA: Diagnosis not present

## 2019-05-28 DIAGNOSIS — R7309 Other abnormal glucose: Secondary | ICD-10-CM | POA: Diagnosis not present

## 2019-06-04 DIAGNOSIS — R7309 Other abnormal glucose: Secondary | ICD-10-CM | POA: Diagnosis not present

## 2019-06-04 DIAGNOSIS — Z7189 Other specified counseling: Secondary | ICD-10-CM | POA: Diagnosis not present

## 2019-07-06 ENCOUNTER — Other Ambulatory Visit: Payer: Self-pay | Admitting: Cardiology

## 2019-07-06 DIAGNOSIS — R002 Palpitations: Secondary | ICD-10-CM

## 2019-07-21 DIAGNOSIS — M25552 Pain in left hip: Secondary | ICD-10-CM | POA: Diagnosis not present

## 2019-07-21 DIAGNOSIS — R1030 Lower abdominal pain, unspecified: Secondary | ICD-10-CM | POA: Diagnosis not present

## 2019-07-21 DIAGNOSIS — R21 Rash and other nonspecific skin eruption: Secondary | ICD-10-CM | POA: Diagnosis not present

## 2019-07-30 ENCOUNTER — Other Ambulatory Visit: Payer: Self-pay | Admitting: Family Medicine

## 2019-07-30 DIAGNOSIS — R1032 Left lower quadrant pain: Secondary | ICD-10-CM

## 2019-07-30 DIAGNOSIS — R102 Pelvic and perineal pain: Secondary | ICD-10-CM

## 2019-08-06 DIAGNOSIS — L308 Other specified dermatitis: Secondary | ICD-10-CM | POA: Diagnosis not present

## 2019-08-11 ENCOUNTER — Other Ambulatory Visit: Payer: Self-pay | Admitting: Family Medicine

## 2019-08-13 ENCOUNTER — Other Ambulatory Visit: Payer: Self-pay

## 2019-08-13 ENCOUNTER — Ambulatory Visit
Admission: RE | Admit: 2019-08-13 | Discharge: 2019-08-13 | Disposition: A | Discharge status: Home / Self Care | Payer: BC Managed Care – PPO | Source: Ambulatory Visit | Attending: Family Medicine | Admitting: Family Medicine

## 2019-08-13 DIAGNOSIS — R1032 Left lower quadrant pain: Secondary | ICD-10-CM

## 2019-08-13 DIAGNOSIS — R102 Pelvic and perineal pain: Secondary | ICD-10-CM

## 2019-08-18 DIAGNOSIS — J029 Acute pharyngitis, unspecified: Secondary | ICD-10-CM | POA: Diagnosis not present

## 2019-08-18 DIAGNOSIS — Z20822 Contact with and (suspected) exposure to covid-19: Secondary | ICD-10-CM | POA: Diagnosis not present

## 2019-08-28 ENCOUNTER — Ambulatory Visit
Admission: RE | Admit: 2019-08-28 | Discharge: 2019-08-28 | Disposition: A | Discharge status: Home / Self Care | Payer: BC Managed Care – PPO | Source: Ambulatory Visit | Attending: Family Medicine | Admitting: Family Medicine

## 2019-08-28 DIAGNOSIS — R1032 Left lower quadrant pain: Secondary | ICD-10-CM

## 2019-08-28 DIAGNOSIS — R102 Pelvic and perineal pain: Secondary | ICD-10-CM

## 2019-08-28 MED ORDER — IOPAMIDOL (ISOVUE-300) INJECTION 61%
75.0000 mL | Freq: Once | INTRAVENOUS | Status: AC | PRN
  Administered 2019-08-28: 75 mL via INTRAVENOUS

## 2019-10-19 DIAGNOSIS — M25572 Pain in left ankle and joints of left foot: Secondary | ICD-10-CM | POA: Diagnosis not present

## 2019-10-19 DIAGNOSIS — M7989 Other specified soft tissue disorders: Secondary | ICD-10-CM | POA: Diagnosis not present

## 2019-10-27 DIAGNOSIS — M25572 Pain in left ankle and joints of left foot: Secondary | ICD-10-CM | POA: Insufficient documentation

## 2020-03-29 DIAGNOSIS — R635 Abnormal weight gain: Secondary | ICD-10-CM | POA: Diagnosis not present

## 2020-03-29 DIAGNOSIS — R7309 Other abnormal glucose: Secondary | ICD-10-CM | POA: Diagnosis not present

## 2020-03-29 DIAGNOSIS — Z125 Encounter for screening for malignant neoplasm of prostate: Secondary | ICD-10-CM | POA: Diagnosis not present

## 2020-03-29 DIAGNOSIS — R7303 Prediabetes: Secondary | ICD-10-CM | POA: Diagnosis not present

## 2020-03-29 DIAGNOSIS — Z0000 Encounter for general adult medical examination without abnormal findings: Secondary | ICD-10-CM | POA: Diagnosis not present

## 2020-04-04 DIAGNOSIS — R946 Abnormal results of thyroid function studies: Secondary | ICD-10-CM | POA: Diagnosis not present

## 2020-04-04 DIAGNOSIS — R7303 Prediabetes: Secondary | ICD-10-CM | POA: Diagnosis not present

## 2020-04-04 DIAGNOSIS — Z0000 Encounter for general adult medical examination without abnormal findings: Secondary | ICD-10-CM | POA: Diagnosis not present

## 2020-04-04 DIAGNOSIS — Z125 Encounter for screening for malignant neoplasm of prostate: Secondary | ICD-10-CM | POA: Diagnosis not present

## 2020-04-11 DIAGNOSIS — B372 Candidiasis of skin and nail: Secondary | ICD-10-CM | POA: Diagnosis not present

## 2020-06-07 DIAGNOSIS — R053 Chronic cough: Secondary | ICD-10-CM | POA: Diagnosis not present

## 2020-06-07 DIAGNOSIS — R059 Cough, unspecified: Secondary | ICD-10-CM | POA: Diagnosis not present

## 2020-09-15 ENCOUNTER — Ambulatory Visit (HOSPITAL_COMMUNITY): Payer: Self-pay | Admitting: Clinical

## 2020-11-02 DIAGNOSIS — M542 Cervicalgia: Secondary | ICD-10-CM | POA: Diagnosis not present

## 2020-11-02 DIAGNOSIS — M4322 Fusion of spine, cervical region: Secondary | ICD-10-CM | POA: Diagnosis not present

## 2020-11-02 DIAGNOSIS — M5416 Radiculopathy, lumbar region: Secondary | ICD-10-CM | POA: Diagnosis not present

## 2020-11-08 ENCOUNTER — Other Ambulatory Visit: Payer: Self-pay | Admitting: Family Medicine

## 2020-11-08 DIAGNOSIS — M5126 Other intervertebral disc displacement, lumbar region: Secondary | ICD-10-CM

## 2020-11-08 DIAGNOSIS — M5416 Radiculopathy, lumbar region: Secondary | ICD-10-CM

## 2020-11-08 DIAGNOSIS — R202 Paresthesia of skin: Secondary | ICD-10-CM

## 2020-11-17 DIAGNOSIS — X58XXXA Exposure to other specified factors, initial encounter: Secondary | ICD-10-CM | POA: Diagnosis not present

## 2020-11-17 DIAGNOSIS — S90122D Contusion of left lesser toe(s) without damage to nail, subsequent encounter: Secondary | ICD-10-CM | POA: Diagnosis not present

## 2020-11-17 DIAGNOSIS — X58XXXD Exposure to other specified factors, subsequent encounter: Secondary | ICD-10-CM | POA: Diagnosis not present

## 2020-11-17 DIAGNOSIS — S9032XA Contusion of left foot, initial encounter: Secondary | ICD-10-CM | POA: Diagnosis not present

## 2020-11-19 ENCOUNTER — Ambulatory Visit
Admission: RE | Admit: 2020-11-19 | Discharge: 2020-11-19 | Disposition: A | Discharge status: Home / Self Care | Payer: BC Managed Care – PPO | Source: Ambulatory Visit | Attending: Family Medicine | Admitting: Family Medicine

## 2020-11-19 DIAGNOSIS — R202 Paresthesia of skin: Secondary | ICD-10-CM

## 2020-11-19 DIAGNOSIS — M48061 Spinal stenosis, lumbar region without neurogenic claudication: Secondary | ICD-10-CM | POA: Diagnosis not present

## 2020-11-19 DIAGNOSIS — M5416 Radiculopathy, lumbar region: Secondary | ICD-10-CM

## 2020-11-19 DIAGNOSIS — M5126 Other intervertebral disc displacement, lumbar region: Secondary | ICD-10-CM

## 2020-12-06 DIAGNOSIS — H31009 Unspecified chorioretinal scars, unspecified eye: Secondary | ICD-10-CM | POA: Diagnosis not present

## 2021-01-20 DIAGNOSIS — M5136 Other intervertebral disc degeneration, lumbar region: Secondary | ICD-10-CM | POA: Diagnosis not present

## 2021-01-20 DIAGNOSIS — M5416 Radiculopathy, lumbar region: Secondary | ICD-10-CM | POA: Diagnosis not present

## 2021-04-08 ENCOUNTER — Other Ambulatory Visit: Payer: Self-pay

## 2021-04-08 ENCOUNTER — Encounter (HOSPITAL_COMMUNITY): Payer: Self-pay

## 2021-04-08 ENCOUNTER — Ambulatory Visit (HOSPITAL_COMMUNITY)
Admission: EM | Admit: 2021-04-08 | Discharge: 2021-04-08 | Disposition: A | Discharge status: Home / Self Care | Payer: BC Managed Care – PPO | Attending: Medical Oncology | Admitting: Medical Oncology

## 2021-04-08 DIAGNOSIS — K0889 Other specified disorders of teeth and supporting structures: Secondary | ICD-10-CM | POA: Diagnosis not present

## 2021-04-08 DIAGNOSIS — M26622 Arthralgia of left temporomandibular joint: Secondary | ICD-10-CM

## 2021-04-08 MED ORDER — KETOROLAC TROMETHAMINE 60 MG/2ML IM SOLN
INTRAMUSCULAR | Status: AC
  Filled 2021-04-08: qty 2

## 2021-04-08 MED ORDER — KETOROLAC TROMETHAMINE 60 MG/2ML IM SOLN
60.0000 mg | Freq: Once | INTRAMUSCULAR | Status: AC
  Administered 2021-04-08: 60 mg via INTRAMUSCULAR

## 2021-04-08 MED ORDER — AMOXICILLIN-POT CLAVULANATE 875-125 MG PO TABS: 1.0000 | ORAL_TABLET | Freq: Two times a day (BID) | ORAL | 0 refills | Status: DC

## 2021-04-08 MED ORDER — METHOCARBAMOL 500 MG PO TABS: 500.0000 mg | ORAL_TABLET | Freq: Two times a day (BID) | ORAL | 0 refills | Status: DC

## 2021-04-08 NOTE — ED Provider Notes (Signed)
[facility]    CSN: [id number] Arrival date & time: 04/08/21  1536      History   Chief Complaint Chief Complaint  Patient presents with   Otalgia    HPI Devan Allmaras is a 50 y.o. male.   HPI  Otalgia: Patient states that he has had left ear pain for the past 2 days. Worse when he opens his jaw.  No other cold or flu symptoms.  No injury, decreased hearing, fever or drainage.  He has tried ibuprofen 800 mg for symptoms but has not taken anything for pain as he knew he was coming here. Possible inferior bottom molar pain.   Past Medical History:  Diagnosis Date   Cellulitis of neck 05/21/11   anterior   H/O TB (tuberculosis)    at age 50, [facility] by [facility]   Prediabetes 09/2017   hgb a1c 5.8    Patient Active Problem List   Diagnosis Date Noted   Atypical chest pain 07/10/2018   Palpitations 07/10/2018   Family history of early CAD 07/10/2018   Prediabetes 09/28/2017   Cellulitis of neck 05/21/2011    Past Surgical History:  Procedure Laterality Date   APPENDECTOMY     MYRINGOTOMY  ~ 1987   bilaterally   VASECTOMY         Home Medications    Prior to Admission medications   Medication Sig Start Date End Date Taking? Authorizing Provider  fluticasone (FLONASE) 50 MCG/ACT nasal spray Place 1 spray into both nostrils 2 (two) times daily. Patient not taking: Reported on 07/09/2018 10/17/17   Jacelyn Pi, Lilia Argue, MD  ibuprofen (ADVIL,MOTRIN) 800 MG tablet Take 0.5 tablets (400 mg total) by mouth every 8 (eight) hours as needed for pain or fever. 05/22/11   Barton Dubois, MD    Family History Family History  Problem Relation Age of Onset   Hypertension Mother    Cancer Mother        Cervical   Heart disease Mother    Heart attack Mother    Hypertension Father    Cancer Father    Heart disease Father    Healthy Sister    Cancer Sister        Breast   Healthy Brother    Cancer Brother     Social History Social History   Tobacco Use    Smoking status: Never   Smokeless tobacco: Never  Vaping Use   Vaping Use: Never used  Substance Use Topics   Alcohol use: No   Drug use: No     Allergies   Patient has no known allergies.   Review of Systems Review of Systems  As stated above in HPI Physical Exam Triage Vital Signs ED Triage Vitals [04/08/21 1702]  Enc Vitals Group     BP (!) 141/101     Pulse Rate 63     Resp 16     Temp 97.9 F (36.6 C)     Temp Source Oral     SpO2 100 %     Weight      Height      Head Circumference      Peak Flow      Pain Score 10     Pain Loc      Pain Edu?      Excl. in [place]?    No data found.  Updated Vital Signs BP (!) 141/101 (BP Location: Right Arm)   Pulse 63   Temp  97.9 F (36.6 C) (Oral)   Resp 16   SpO2 100%   Physical Exam Vitals and nursing note reviewed.  Constitutional:      General: He is not in acute distress.    Appearance: Normal appearance. He is not ill-appearing, toxic-appearing or diaphoretic.  HENT:     Head: Normocephalic and atraumatic.     Right Ear: Tympanic membrane normal.     Left Ear: Tympanic membrane normal.     Nose: Nose normal.     Mouth/Throat:     Mouth: Mucous membranes are moist.      Comments: Tenderness of the left TMJ area. There is mild effusion around this area and inferior jaw near left ear.  Neurological:     Mental Status: He is alert.     UC Treatments / Results  Labs (all labs ordered are listed, but only abnormal results are displayed) Labs Reviewed - No data to display  EKG   Radiology No results found.  Procedures Procedures (including critical care time)  Medications Ordered in UC Medications - No data to display  Initial Impression / Assessment and Plan / UC Course  I have reviewed the triage vital signs and the nursing notes.  Pertinent labs & imaging results that were available during my care of the patient were reviewed by me and considered in my medical decision making (see chart for  details).     New.  I discussed with patient that it is likely TMJ causing his symptoms however a dental infection could be possible so I am going to double cover.  We discussed that he has not had any pain medicine today so I am going to give him an injection of Toradol to help with inflammation and pain.  Starting tomorrow he can take ibuprofen and or Tylenol as needed and indicated.  I have also sent in a muscle relaxer for his TMJ.  Reviewed this along with TMJ normal advisement.  Follow-up with dentist.  Final Clinical Impressions(s) / UC Diagnoses   Final diagnoses:  None   Discharge Instructions   None    ED Prescriptions   None    PDMP not reviewed this encounter.   Rushie Chestnut, [place] 04/08/21 1801

## 2021-04-08 NOTE — ED Triage Notes (Signed)
Pt presents with left ear pain x 2 days  

## 2021-04-08 NOTE — Discharge Instructions (Addendum)
After tonight you can restart your ibuprofen medication. Please follow up with your dentist

## 2021-04-11 DIAGNOSIS — R6884 Jaw pain: Secondary | ICD-10-CM | POA: Diagnosis not present

## 2021-08-10 DIAGNOSIS — Z0000 Encounter for general adult medical examination without abnormal findings: Secondary | ICD-10-CM | POA: Diagnosis not present

## 2021-08-10 DIAGNOSIS — Z125 Encounter for screening for malignant neoplasm of prostate: Secondary | ICD-10-CM | POA: Diagnosis not present

## 2021-08-10 DIAGNOSIS — R946 Abnormal results of thyroid function studies: Secondary | ICD-10-CM | POA: Diagnosis not present

## 2021-08-10 DIAGNOSIS — R7303 Prediabetes: Secondary | ICD-10-CM | POA: Diagnosis not present

## 2021-08-22 ENCOUNTER — Emergency Department (HOSPITAL_COMMUNITY)
Admission: EM | Admit: 2021-08-22 | Discharge: 2021-08-22 | Disposition: A | Discharge status: Home / Self Care | Payer: BC Managed Care – PPO | Attending: Emergency Medicine | Admitting: Emergency Medicine

## 2021-08-22 ENCOUNTER — Encounter (HOSPITAL_COMMUNITY): Payer: Self-pay | Admitting: Emergency Medicine

## 2021-08-22 ENCOUNTER — Other Ambulatory Visit: Payer: Self-pay

## 2021-08-22 DIAGNOSIS — H6121 Impacted cerumen, right ear: Secondary | ICD-10-CM | POA: Diagnosis not present

## 2021-08-22 DIAGNOSIS — H9201 Otalgia, right ear: Secondary | ICD-10-CM | POA: Diagnosis not present

## 2021-08-22 DIAGNOSIS — H9202 Otalgia, left ear: Secondary | ICD-10-CM | POA: Insufficient documentation

## 2021-08-22 MED ORDER — AMOXICILLIN 500 MG PO CAPS: 500.0000 mg | ORAL_CAPSULE | Freq: Three times a day (TID) | ORAL | 0 refills | Status: DC

## 2021-08-22 MED ORDER — CARBAMIDE PEROXIDE 6.5 % OT SOLN
5.0000 [drp] | Freq: Once | OTIC | Status: AC
  Administered 2021-08-22: 5 [drp] via OTIC
  Filled 2021-08-22: qty 15

## 2021-08-22 NOTE — Discharge Instructions (Addendum)
I would recommend that you purchase Debrox drops and continue to use them as needed in the right ear.  Please follow the instructions on the box. ? ?I am unable to see a large portion of your eardrum but you could have an underlying ear infection.  I am going to prescribe you an antibiotic called amoxicillin.  Please take this 3 times per day for the next 7 days. ? ?If you develop any new or worsening symptoms please come back to the emergency department. ?

## 2021-08-22 NOTE — ED Provider Notes (Signed)
?[facility] EMERGENCY DEPARTMENT ?Provider Note ? ? ?CSN: [id number] ?Arrival date & time: 08/22/21  1600 ? ?  ? ?History ? ?Chief Complaint  ?Patient presents with  ? Hearing Problem  ? ? ?Devon Martinez is a 51 y.o. male. ? ?HPI ?Patient is a 51 year old male who presents to the emergency department due to right ear pain as well as hearing loss.  Initially started with ear pain yesterday.  He attempted to remove cerumen from the right ear and states that after removing a small amount his symptoms persisted.  Denies any ear drainage.  Also reports mild pain in the left ear but states the right ear is much worse.  No cough, rhinorrhea, sore throat, fevers, chills. ?  ? ?Home Medications ?Prior to Admission medications   ?Medication Sig Start Date End Date Taking? Authorizing Provider  ?amoxicillin (AMOXIL) 500 MG capsule Take 1 capsule (500 mg total) by mouth 3 (three) times daily. 08/22/21  Yes Placido Sou, PA-C  ?fluticasone (FLONASE) 50 MCG/ACT nasal spray Place 1 spray into both nostrils 2 (two) times daily. ?Patient not taking: Reported on 07/09/2018 10/17/17   Lezlie Lye, Meda Coffee, MD  ?ibuprofen (ADVIL,MOTRIN) 800 MG tablet Take 0.5 tablets (400 mg total) by mouth every 8 (eight) hours as needed for pain or fever. 05/22/11   Vassie Loll, MD  ?methocarbamol (ROBAXIN) 500 MG tablet Take 1 tablet (500 mg total) by mouth 2 (two) times daily. 04/08/21   Rushie Chestnut, PA-C  ?   ? ?Allergies    ?Patient has no known allergies.   ? ?Review of Systems   ?Review of Systems  ?Constitutional:  Negative for chills and fever.  ?HENT:  Positive for ear pain. Negative for congestion, ear discharge, rhinorrhea and sore throat.   ?Respiratory:  Negative for cough.   ? ?Physical Exam ?Updated Vital Signs ?BP (!) 159/101   Pulse 86   Temp 98.4 ?F (36.9 ?C) (Oral)   Resp 16   SpO2 100%  ?Physical Exam ?Vitals and nursing note reviewed.  ?Constitutional:   ?   General: He is not in acute distress. ?    Appearance: He is well-developed.  ?HENT:  ?   Head: Normocephalic and atraumatic.  ?   Right Ear: External ear normal. There is impacted cerumen.  ?   Left Ear: Tympanic membrane, ear canal and external ear normal. There is no impacted cerumen.  ?   Ears:  ?   Comments: Right EAC impacted with cerumen.  Irrigated ear removing a moderate amount of cerumen and was able to visualize a small amount of TM which does not appear erythematous but cannot tell if the TM is bulging. ?Eyes:  ?   General: No scleral icterus.    ?   Right eye: No discharge.     ?   Left eye: No discharge.  ?   Conjunctiva/sclera: Conjunctivae normal.  ?Neck:  ?   Trachea: No tracheal deviation.  ?Cardiovascular:  ?   Rate and Rhythm: Normal rate.  ?Pulmonary:  ?   Effort: Pulmonary effort is normal. No respiratory distress.  ?   Breath sounds: No stridor.  ?Abdominal:  ?   General: Abdomen is flat. There is no distension.  ?Musculoskeletal:     ?   General: No swelling or deformity.  ?   Cervical back: Neck supple.  ?Skin: ?   General: Skin is warm and dry.  ?   Findings: No rash.  ?Neurological:  ?  Mental Status: He is alert.  ?   Cranial Nerves: Cranial nerve deficit: no gross deficits.  ? ? ?ED Results / Procedures / Treatments   ?Labs ?(all labs ordered are listed, but only abnormal results are displayed) ?Labs Reviewed - No data to display ? ?EKG ?None ? ?Radiology ?No results found. ? ?Procedures ?Procedures  ? ?Medications Ordered in ED ?Medications  ?carbamide peroxide (DEBROX) 6.5 % OTIC (EAR) solution 5 drop (5 drops Right EAR Given 08/22/21 1823)  ? ? ?ED Course/ Medical Decision Making/ A&P ?  ?                        ?Medical Decision Making ?Risk ?OTC drugs. ? ?Pt is a 51 y.o. male who presents to the emergency department due to right ear pain and hearing loss that started yesterday. ? ?On my initial exam left ear, EAC, and TM appears normal.  Right ear appears normal but EAC is completely occluded with cerumen.  Unable to  visualize the TM.  Debrox drops were applied and ear was irrigated which resolved about 30 to 40% of the impaction.  States his hearing has moderately improved.  I am able to visualize a small portion of the TM which does not appear erythematous.  Unable to tell if it is nonbulging.  Due to patient's pain as well as difficulty visualizing the TM will discharge on a course of amoxicillin for possible otitis media. ? ?Patient appears stable for discharge at this time and he is agreeable.  Recommended continued use of Debrox.  Discussed return precautions.  His questions were answered and he was amicable at the time of discharge. ? ?Note: Portions of this report may have been transcribed using voice recognition software. Every effort was made to ensure accuracy; however, inadvertent computerized transcription errors may be present.  ? ?Final Clinical Impression(s) / ED Diagnoses ?Final diagnoses:  ?Impacted cerumen of right ear  ? ?Rx / DC Orders ?ED Discharge Orders   ? ?      Ordered  ?  amoxicillin (AMOXIL) 500 MG capsule  3 times daily       ? 08/22/21 1859  ? ?  ?  ? ?  ? ? ?  ?Placido Sou, PA-C ?08/22/21 1904 ? ?  ?Gloris Manchester, MD ?08/23/21 0215 ? ?

## 2021-08-22 NOTE — ED Triage Notes (Signed)
Patient coming from home, complains of hearing loss in right ear first noticed yesterday, states ear feels full. Denies any recent sickness. VSS. NAD. ?

## 2022-08-04 ENCOUNTER — Other Ambulatory Visit: Payer: Self-pay

## 2022-08-04 ENCOUNTER — Emergency Department (HOSPITAL_COMMUNITY): Payer: Medicaid Other

## 2022-08-04 ENCOUNTER — Encounter (HOSPITAL_COMMUNITY): Payer: Self-pay

## 2022-08-04 ENCOUNTER — Emergency Department (HOSPITAL_COMMUNITY)
Admission: EM | Admit: 2022-08-04 | Discharge: 2022-08-04 | Disposition: A | Discharge status: Home / Self Care | Payer: Medicaid Other | Attending: Emergency Medicine | Admitting: Emergency Medicine

## 2022-08-04 DIAGNOSIS — M5412 Radiculopathy, cervical region: Secondary | ICD-10-CM

## 2022-08-04 DIAGNOSIS — M542 Cervicalgia: Secondary | ICD-10-CM | POA: Diagnosis present

## 2022-08-04 MED ORDER — PREDNISONE 20 MG PO TABS: 40.0000 mg | ORAL_TABLET | Freq: Every day | ORAL | 0 refills | Status: DC

## 2022-08-04 MED ORDER — GABAPENTIN 300 MG PO CAPS: 300.0000 mg | ORAL_CAPSULE | Freq: Three times a day (TID) | ORAL | 0 refills | Status: DC

## 2022-08-04 MED ORDER — KETOROLAC TROMETHAMINE 15 MG/ML IJ SOLN
15.0000 mg | Freq: Once | INTRAMUSCULAR | Status: AC
  Administered 2022-08-04: 15 mg via INTRAMUSCULAR
  Filled 2022-08-04: qty 1

## 2022-08-04 NOTE — ED Triage Notes (Addendum)
Patient reports right sided neck pain that extends down to right shoulder blade and down arm x1 week, states pain is sharp and tingling. States he was in [facility] march 12th and was seen by provider out of state, scans negative at that time. Has used warm/cold compresses for pain. Denies any other injury.

## 2022-08-04 NOTE — Discharge Instructions (Addendum)
Follow-up with neurosurgery.  The gabapentin may also help with the nerve symptoms.  Your blood pressure was also elevated will need to be followed.  The steroids hopefully will also help with the symptoms.
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
## 2022-09-03 ENCOUNTER — Ambulatory Visit: Payer: Medicaid Other | Admitting: Nurse Practitioner

## 2022-09-10 ENCOUNTER — Ambulatory Visit: Payer: Self-pay | Admitting: Nurse Practitioner
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
## 2022-10-24 ENCOUNTER — Ambulatory Visit: Payer: Medicaid Other | Attending: Family Medicine

## 2022-10-24 ENCOUNTER — Other Ambulatory Visit: Payer: Self-pay

## 2022-10-24 DIAGNOSIS — M542 Cervicalgia: Secondary | ICD-10-CM | POA: Insufficient documentation

## 2022-10-24 DIAGNOSIS — M6281 Muscle weakness (generalized): Secondary | ICD-10-CM | POA: Diagnosis present

## 2022-10-24 DIAGNOSIS — M792 Neuralgia and neuritis, unspecified: Secondary | ICD-10-CM | POA: Insufficient documentation
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
## 2022-11-07 ENCOUNTER — Ambulatory Visit: Payer: Medicaid Other | Attending: Family Medicine

## 2022-11-07 DIAGNOSIS — M6281 Muscle weakness (generalized): Secondary | ICD-10-CM | POA: Insufficient documentation

## 2022-11-07 DIAGNOSIS — M542 Cervicalgia: Secondary | ICD-10-CM | POA: Insufficient documentation

## 2022-11-07 DIAGNOSIS — M792 Neuralgia and neuritis, unspecified: Secondary | ICD-10-CM | POA: Diagnosis present
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
## 2022-11-09 ENCOUNTER — Ambulatory Visit: Payer: Medicaid Other

## 2022-11-09 DIAGNOSIS — M6281 Muscle weakness (generalized): Secondary | ICD-10-CM

## 2022-11-09 DIAGNOSIS — M542 Cervicalgia: Secondary | ICD-10-CM

## 2022-11-09 DIAGNOSIS — M792 Neuralgia and neuritis, unspecified: Secondary | ICD-10-CM
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
## 2022-11-13 ENCOUNTER — Ambulatory Visit: Payer: Medicaid Other

## 2022-11-13 ENCOUNTER — Telehealth: Payer: Self-pay

## 2022-11-13 NOTE — Telephone Encounter (Signed)
LVM re: no show appt. Reminded pt of his next appt and advised about attendance policy.
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
## 2022-11-15 ENCOUNTER — Ambulatory Visit: Payer: Medicaid Other

## 2022-11-19 ENCOUNTER — Telehealth: Payer: Self-pay | Admitting: Physical Therapy

## 2022-11-19 ENCOUNTER — Ambulatory Visit: Payer: Medicaid Other | Admitting: Physical Therapy

## 2022-11-19 NOTE — Telephone Encounter (Signed)
Contacted patient regarding multiple no shows. Left voicemail reminder of attendance policy. Requested that patient call clinic if he would like to continued PT. All future appointments are canceled due to attendance policy.

## 2022-11-21 ENCOUNTER — Ambulatory Visit: Payer: Medicaid Other | Admitting: Physical Therapy
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
## 2022-11-27 ENCOUNTER — Ambulatory Visit: Payer: Medicaid Other

## 2022-11-27 DIAGNOSIS — M542 Cervicalgia: Secondary | ICD-10-CM

## 2022-11-27 DIAGNOSIS — M792 Neuralgia and neuritis, unspecified: Secondary | ICD-10-CM

## 2022-11-27 DIAGNOSIS — M6281 Muscle weakness (generalized): Secondary | ICD-10-CM

## 2022-11-29 ENCOUNTER — Ambulatory Visit: Payer: Medicaid Other

## 2022-11-29 DIAGNOSIS — Z981 Arthrodesis status: Secondary | ICD-10-CM | POA: Insufficient documentation

## 2022-11-29 DIAGNOSIS — G8929 Other chronic pain: Secondary | ICD-10-CM | POA: Insufficient documentation

## 2022-11-30 DIAGNOSIS — H6993 Unspecified Eustachian tube disorder, bilateral: Secondary | ICD-10-CM | POA: Insufficient documentation

## 2022-11-30 DIAGNOSIS — H7413 Adhesive middle ear disease, bilateral: Secondary | ICD-10-CM | POA: Insufficient documentation

## 2022-11-30 DIAGNOSIS — H906 Mixed conductive and sensorineural hearing loss, bilateral: Secondary | ICD-10-CM | POA: Insufficient documentation

## 2022-11-30 DIAGNOSIS — H900 Conductive hearing loss, bilateral: Secondary | ICD-10-CM | POA: Insufficient documentation

## 2022-12-04 ENCOUNTER — Ambulatory Visit: Payer: Medicaid Other
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
## 2022-12-06 ENCOUNTER — Encounter: Payer: Medicaid Other | Admitting: Physical Therapy

## 2022-12-07 ENCOUNTER — Ambulatory Visit: Payer: Medicaid Other | Attending: Family Medicine

## 2022-12-07 DIAGNOSIS — M792 Neuralgia and neuritis, unspecified: Secondary | ICD-10-CM | POA: Diagnosis present

## 2022-12-07 DIAGNOSIS — M6281 Muscle weakness (generalized): Secondary | ICD-10-CM | POA: Diagnosis present

## 2022-12-07 DIAGNOSIS — M542 Cervicalgia: Secondary | ICD-10-CM | POA: Diagnosis present

## 2023-01-07 DIAGNOSIS — Z800 Family history of malignant neoplasm of digestive organs: Secondary | ICD-10-CM | POA: Insufficient documentation

## 2023-01-07 DIAGNOSIS — E7800 Pure hypercholesterolemia, unspecified: Secondary | ICD-10-CM | POA: Insufficient documentation

## 2023-04-08 DIAGNOSIS — F32A Depression, unspecified: Secondary | ICD-10-CM | POA: Insufficient documentation

## 2023-05-13 DIAGNOSIS — H6122 Impacted cerumen, left ear: Secondary | ICD-10-CM | POA: Insufficient documentation

## 2023-06-12 ENCOUNTER — Encounter (HOSPITAL_BASED_OUTPATIENT_CLINIC_OR_DEPARTMENT_OTHER): Payer: Self-pay

## 2023-06-12 ENCOUNTER — Emergency Department (HOSPITAL_BASED_OUTPATIENT_CLINIC_OR_DEPARTMENT_OTHER): Payer: Medicaid Other

## 2023-06-12 ENCOUNTER — Other Ambulatory Visit: Payer: Self-pay

## 2023-06-12 ENCOUNTER — Emergency Department (HOSPITAL_BASED_OUTPATIENT_CLINIC_OR_DEPARTMENT_OTHER)
Admission: EM | Admit: 2023-06-12 | Discharge: 2023-06-12 | Disposition: A | Discharge status: Home / Self Care | Payer: Medicaid Other | Attending: Emergency Medicine | Admitting: Emergency Medicine

## 2023-06-12 DIAGNOSIS — H9201 Otalgia, right ear: Secondary | ICD-10-CM | POA: Insufficient documentation

## 2023-06-12 DIAGNOSIS — S0300XA Dislocation of jaw, unspecified side, initial encounter: Secondary | ICD-10-CM | POA: Diagnosis not present

## 2023-06-12 DIAGNOSIS — S0993XA Unspecified injury of face, initial encounter: Secondary | ICD-10-CM | POA: Diagnosis present

## 2023-06-12 DIAGNOSIS — X58XXXA Exposure to other specified factors, initial encounter: Secondary | ICD-10-CM | POA: Insufficient documentation

## 2023-06-12 LAB — BASIC METABOLIC PANEL
Anion gap: 7 (ref 5–15)
BUN: 13 mg/dL (ref 6–20)
CO2: 29 mmol/L (ref 22–32)
Calcium: 9.5 mg/dL (ref 8.9–10.3)
Chloride: 106 mmol/L (ref 98–111)
Creatinine, Ser: 0.92 mg/dL (ref 0.61–1.24)
GFR, Estimated: >60 mL/min (ref >60)
Glucose, Bld: 95 mg/dL (ref 70–99)
Potassium: 3.8 mmol/L (ref 3.5–5.1)
Sodium: 142 mmol/L (ref 135–145)

## 2023-06-12 LAB — CBC WITH DIFFERENTIAL/PLATELET
Abs Immature Granulocytes: 0.01 10*3/uL (ref 0.00–0.07)
Basophils Absolute: 0 10*3/uL (ref 0.0–0.1)
Basophils Relative: 1 %
Eosinophils Absolute: 0.1 10*3/uL (ref 0.0–0.5)
Eosinophils Relative: 2 %
HCT: 41 % (ref 39.0–52.0)
Hemoglobin: 13.5 g/dL (ref 13.0–17.0)
Immature Granulocytes: 0 %
Lymphocytes Relative: 48 %
Lymphs Abs: 3.5 10*3/uL (ref 0.7–4.0)
MCH: 29.3 pg (ref 26.0–34.0)
MCHC: 32.9 g/dL (ref 30.0–36.0)
MCV: 89.1 fL (ref 80.0–100.0)
Monocytes Absolute: 0.6 10*3/uL (ref 0.1–1.0)
Monocytes Relative: 8 %
Neutro Abs: 2.9 10*3/uL (ref 1.7–7.7)
Neutrophils Relative %: 41 %
Platelets: 241 10*3/uL (ref 150–400)
RBC: 4.6 MIL/uL (ref 4.22–5.81)
RDW: 12.9 % (ref 11.5–15.5)
WBC: 7.2 10*3/uL (ref 4.0–10.5)
nRBC: 0 % (ref 0.0–0.2)

## 2023-06-12 MED ORDER — FAMOTIDINE 40 MG PO TABS: 40.0000 mg | ORAL_TABLET | Freq: Every day | ORAL | 0 refills | Status: DC

## 2023-06-12 MED ORDER — CYCLOBENZAPRINE HCL 10 MG PO TABS: 10.0000 mg | ORAL_TABLET | Freq: Every day | ORAL | 0 refills | Status: DC

## 2023-06-12 MED ORDER — NAPROXEN 500 MG PO TABS: 500.0000 mg | ORAL_TABLET | Freq: Two times a day (BID) | ORAL | 0 refills | Status: AC

## 2023-06-12 MED ORDER — KETOROLAC TROMETHAMINE 60 MG/2ML IM SOLN
30.0000 mg | Freq: Once | INTRAMUSCULAR | Status: AC
  Administered 2023-06-12: 30 mg via INTRAMUSCULAR
  Filled 2023-06-12: qty 2

## 2023-06-12 NOTE — Discharge Instructions (Addendum)
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  Here are the medications I would like you to take.  I would like you to take naproxen once in the morning once in the evening for the next 2 weeks.  I would like you to take famotidine while you are taking naproxen to help protect your stomach.  I would also like you to take cyclobenzaprine, a muscle relaxer each evening.  Do not drive while you are taking this medication.  Follow-up with your PCP within 1 to 2 weeks.  Return to the emergency department if develop fever, noticed swelling or redness on your face.

## 2023-06-12 NOTE — ED Provider Triage Note (Signed)
Emergency Medicine Provider Triage Evaluation Note  Devon Martinez , a 53 y.o. male  was evaluated in triage.  Pt complains of otalgia.  Review of Systems  Positive:  Negative:   Physical Exam  Ht 6' (1.829 m)   Wt 88.5 kg   BMI 26.45 kg/m  Gen:   Awake, no distress   Resp:  Normal effort  MSK:   Moves extremities without difficulty  Other:   Medical Decision Making  Medically screening exam initiated at 7:07 PM.  Appropriate orders placed.  Gianpaolo Mindel was informed that the remainder of the evaluation will be completed by another provider, this initial triage assessment does not replace that evaluation, and the importance of remaining in the ED until their evaluation is complete.  Started as right ear pain. Pain is now radiating down into right arm and is causing numbness? Hx of same. Stating that it is hard to open jaw.    Dorthy Cooler, [place] 06/12/23 1909

## 2023-06-12 NOTE — ED Triage Notes (Addendum)
Pt POV d/t right ear pain - has hx of same and "feels the same".  Has had for 3 days and radiates down right neck and right arm that causes numbness.

## 2023-06-12 NOTE — ED Provider Notes (Signed)
[place] EMERGENCY DEPARTMENT AT [facility] Provider Note   CSN: [id number] Arrival date & time: 06/12/23  1759     History  Chief Complaint  Patient presents with   Otalgia    Devon Martinez is a 53 y.o. male.  This is a 53 year old male who is here today for pain on the right side of his jaw that is worse when he opens his mouth.  Patient's symptoms of ongoing for a few days.  No fever, no chills, no dental pain.  No difficulty with hearing.   Otalgia      Home Medications Prior to Admission medications   Medication Sig Start Date End Date Taking? Authorizing Provider  cyclobenzaprine (FLEXERIL) 10 MG tablet Take 1 tablet (10 mg total) by mouth at bedtime. 06/12/23  Yes Anders Simmonds T, DO  famotidine (PEPCID) 40 MG tablet Take 1 tablet (40 mg total) by mouth daily for 15 days. 06/12/23 06/27/23 Yes Anders Simmonds T, DO  naproxen (NAPROSYN) 500 MG tablet Take 1 tablet (500 mg total) by mouth 2 (two) times daily for 15 days. 06/12/23 06/27/23 Yes Anders Simmonds T, DO  amoxicillin (AMOXIL) 500 MG capsule Take 1 capsule (500 mg total) by mouth 3 (three) times daily. 08/22/21   Placido Sou, PA-C  fluticasone (FLONASE) 50 MCG/ACT nasal spray Place 1 spray into both nostrils 2 (two) times daily. Patient not taking: Reported on 07/09/2018 10/17/17   Lezlie Lye, Meda Coffee, MD  gabapentin (NEURONTIN) 300 MG capsule Take 1 capsule (300 mg total) by mouth 3 (three) times daily. 08/04/22   Benjiman Core, MD  ibuprofen (ADVIL,MOTRIN) 800 MG tablet Take 0.5 tablets (400 mg total) by mouth every 8 (eight) hours as needed for pain or fever. 05/22/11   Vassie Loll, MD  methocarbamol (ROBAXIN) 500 MG tablet Take 1 tablet (500 mg total) by mouth 2 (two) times daily. 04/08/21   Rushie Chestnut, PA-C  predniSONE (DELTASONE) 20 MG tablet Take 2 tablets (40 mg total) by mouth daily. 08/04/22   Benjiman Core, MD      Allergies    Patient has no known allergies.    Review of  Systems   Review of Systems  HENT:  Positive for ear pain.     Physical Exam Updated Vital Signs BP (!) 165/109 (BP Location: Right Arm)   Pulse 96   Temp 99.1 F (37.3 C)   Resp 20   Ht 6' (1.829 m)   Wt 88.5 kg   SpO2 96%   BMI 26.45 kg/m  Physical Exam Vitals reviewed.  HENT:     Head: Normocephalic and atraumatic.     Comments: TMJ tenderness with opening and closing the mouth    Right Ear: Tympanic membrane normal.     Left Ear: Tympanic membrane normal.  Musculoskeletal:     Cervical back: Normal range of motion. No rigidity.  Lymphadenopathy:     Cervical: No cervical adenopathy.  Neurological:     Mental Status: He is alert.     ED Results / Procedures / Treatments   Labs (all labs ordered are listed, but only abnormal results are displayed) Labs Reviewed  BASIC METABOLIC PANEL  CBC WITH DIFFERENTIAL/PLATELET    EKG None  Radiology CT Head Wo Contrast Result Date: 06/12/2023 CLINICAL DATA:  Right ear pain. EXAM: CT HEAD WITHOUT CONTRAST TECHNIQUE: Contiguous axial images were obtained from the base of the skull through the vertex without intravenous contrast. RADIATION DOSE REDUCTION: This exam was performed according to  the departmental dose-optimization program which includes automated exposure control, adjustment of the mA and/or kV according to patient size and/or use of iterative reconstruction technique. COMPARISON:  None Available. FINDINGS: Brain: No evidence of acute infarction, hemorrhage, hydrocephalus, extra-axial collection or mass lesion/mass effect. Vascular: No hyperdense vessel or unexpected calcification. Skull: Normal. Negative for fracture or focal lesion. Sinuses/Orbits: No acute finding. Other: None. IMPRESSION: No acute intracranial pathology. Electronically Signed   By: Aram Candela M.D.   On: 06/12/2023 20:12    Procedures Procedures    Medications Ordered in ED Medications  ketorolac (TORADOL) injection 30 mg (has no  administration in time range)    ED Course/ Medical Decision Making/ A&P                                 Medical Decision Making 53 year old male here today with right sided head pain.  Differential diagnoses include TMJ dysfunction, less likely mastoiditis, less likely deep space infection.  Plan-patient with reproducible and provokable pain at the TMJ.  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  Looked in the patient's mouth and ears, no overt signs of infection.  Will give the patient some Toradol here, will discharge him with prescription for naproxen, cyclobenzaprine.  My independent review the patient's head CT ordered at triage shows no intercranial hemorrhage.  I reviewed the patient's labs.  Amount and/or Complexity of Data Reviewed Labs: ordered. Radiology: ordered.  Risk Prescription drug management.           Final Clinical Impression(s) / ED Diagnoses Final diagnoses:  Dislocation of temporomandibular joint, initial encounter    Rx / DC Orders ED Discharge Orders          Ordered    naproxen (NAPROSYN) 500 MG tablet  2 times daily        06/12/23 2159    famotidine (PEPCID) 40 MG tablet  Daily        06/12/23 2159    cyclobenzaprine (FLEXERIL) 10 MG tablet  Daily at bedtime        06/12/23 2159              Anders Simmonds T, DO 06/12/23 2200

## 2023-06-15 IMAGING — MR MR LUMBAR SPINE W/O CM
4 of 5 series · 25 of 48 positions shown · non-contrast
Comparison: None.

CLINICAL DATA: Displacement of lumbar intervertebral disc without
myelopathy; lumbar radiculopathy; paresthesia; technologist note
states low back pain and numbness radiating into bilateral buttocks
and both legs

EXAM:
MRI LUMBAR SPINE WITHOUT CONTRAST
TECHNIQUE: Multiplanar, multisequence MR imaging of the lumbar spine was
performed. No intravenous contrast was administered.

[Series 4: T2 · sagittal · 4.0mm · 0.55mm/px · 6 of 18 slices shown (1 of 2)]
[im 1/18]
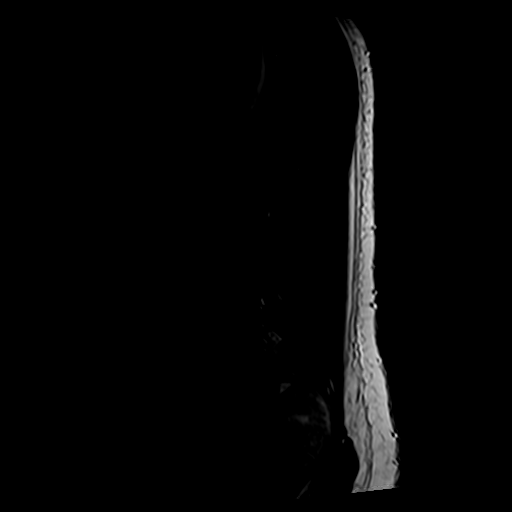
[im 4/18]
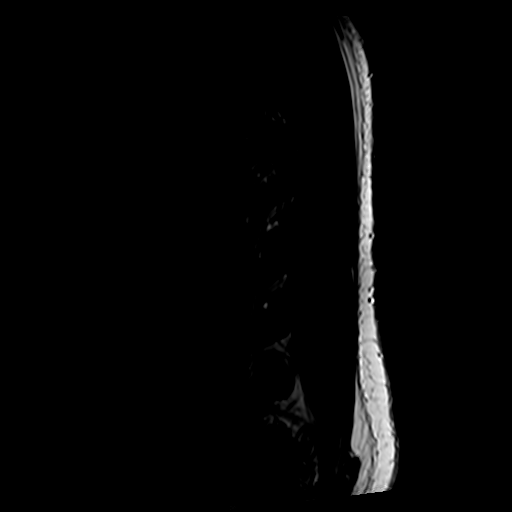
[im 7/18]
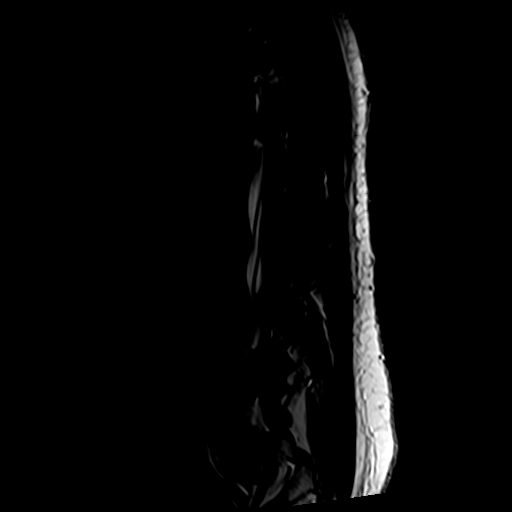
[im 11/18]
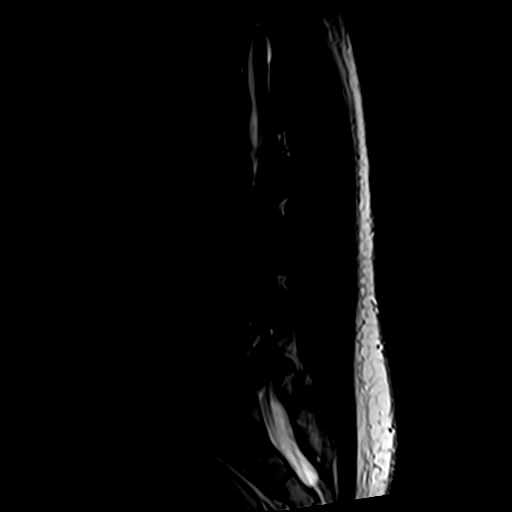
[im 14/18]
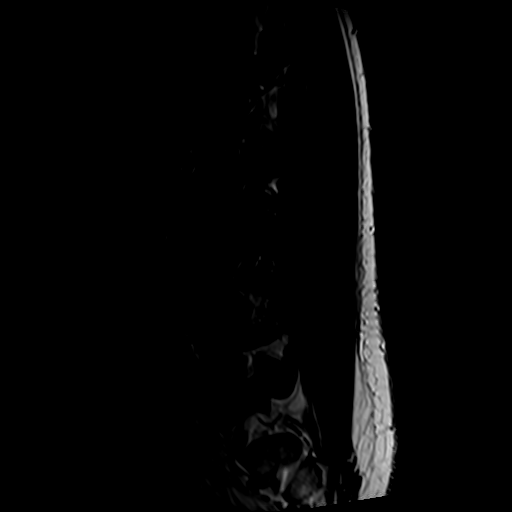
[im 18/18]
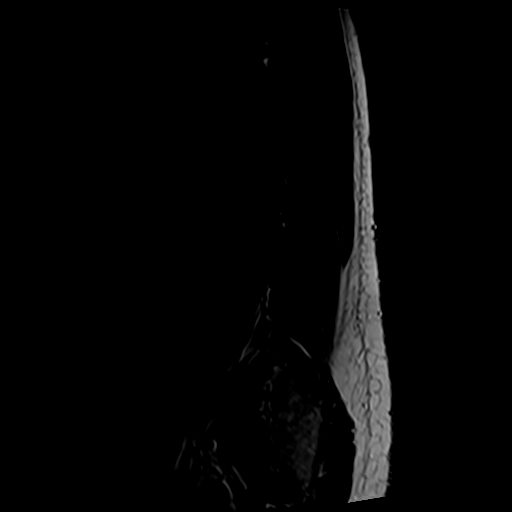

[Series 6: T1 · sagittal · 4.0mm · 0.53mm/px · 7 of 18 slices shown (1 of 2)]
[im 1/18]
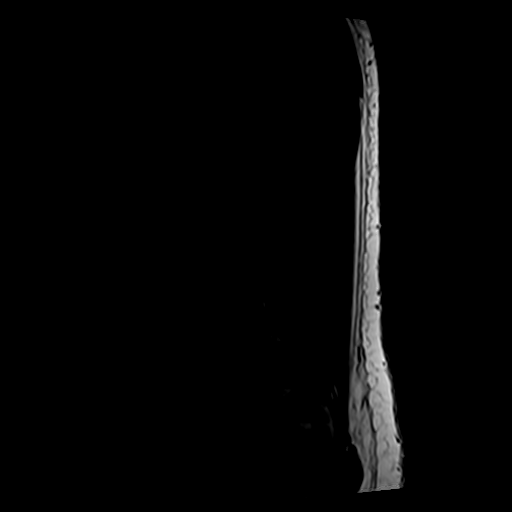
[im 3/18]
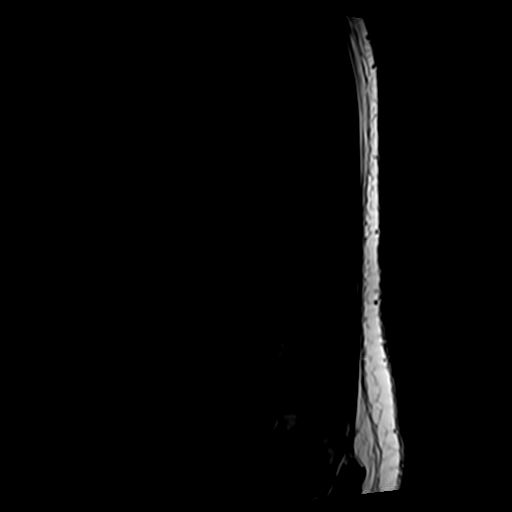
[im 6/18]
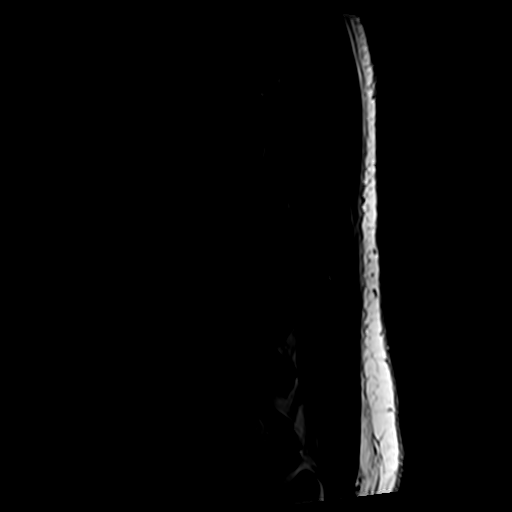
[im 9/18]
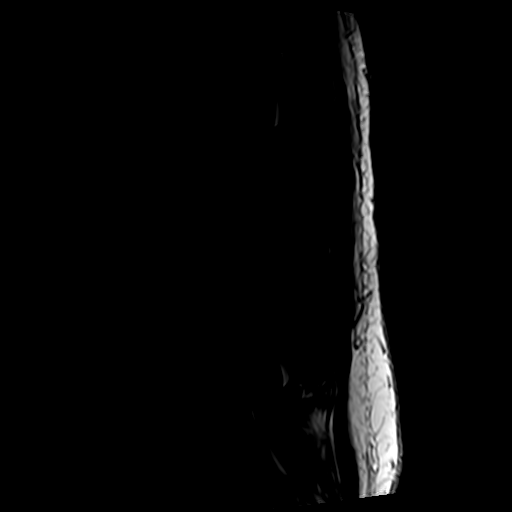
[im 12/18]
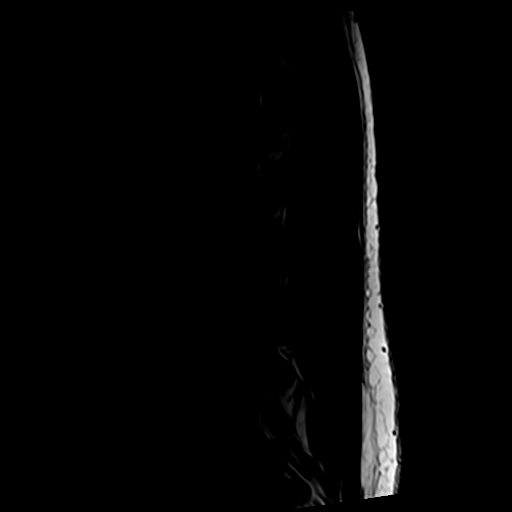
[im 15/18]
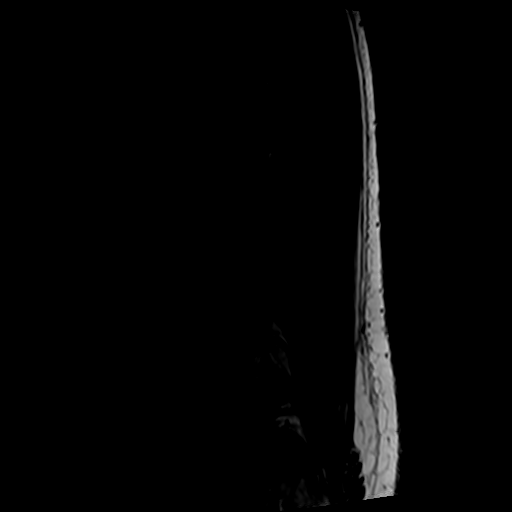
[im 18/18]
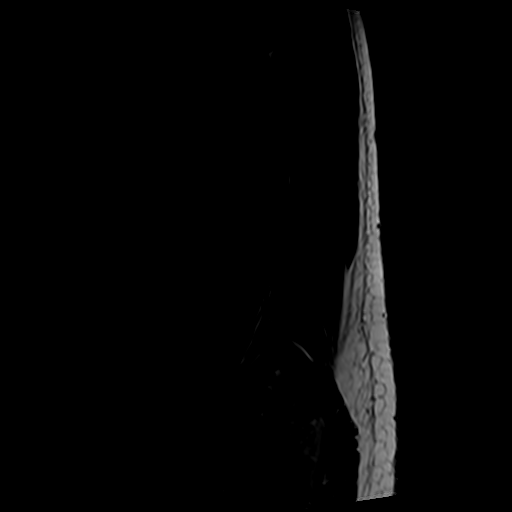

[Series 7: T2 · axial · 4.0mm · 0.70mm/px · z∈[-58,+133]mm · 8 of 37 slices shown (2 of 2)]
[im 1/37]
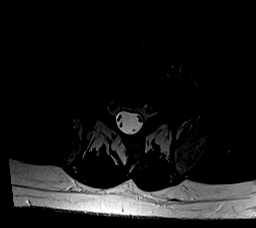
[im 6/37]
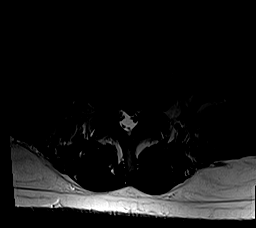
[im 12/37]
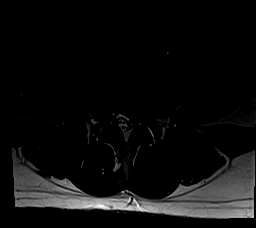
[im 17/37]
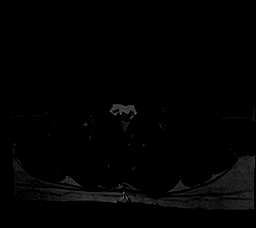
[im 20/37]
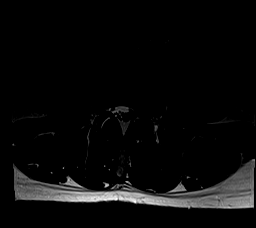
[im 25/37]
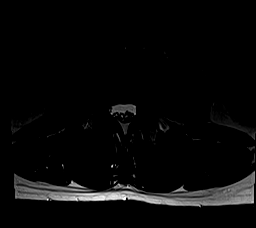
[im 31/37]
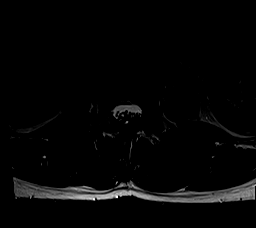
[im 37/37]
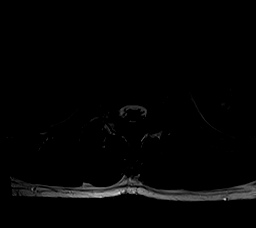

[Series 8: T1 · axial · 4.0mm · 0.35mm/px · z∈[-58,+102]mm · 4 of 37 slices shown (2 of 2)]
[im 1/37]
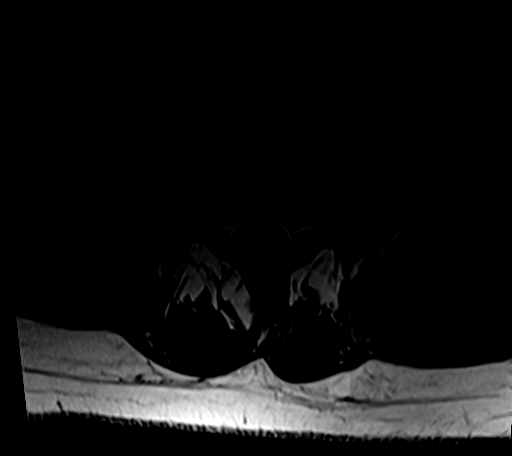
[im 6/37]
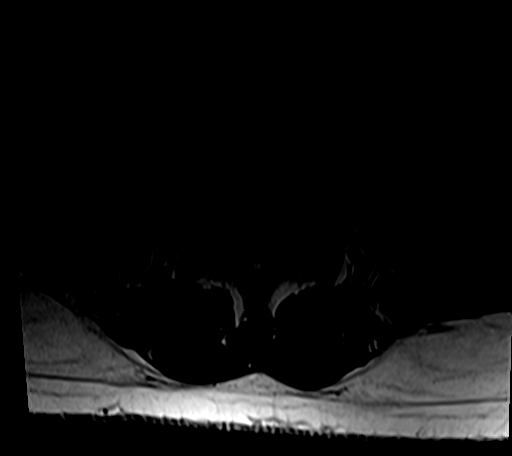
[im 20/37]
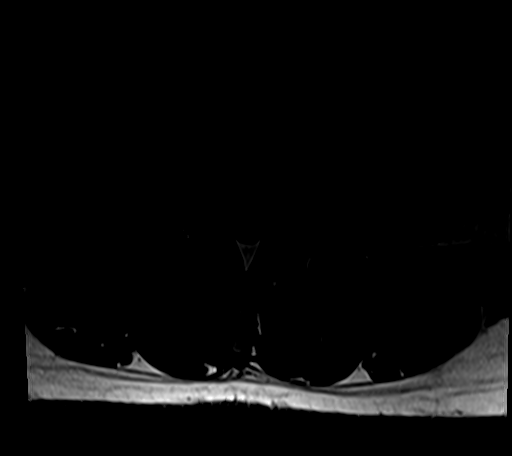
[im 31/37]
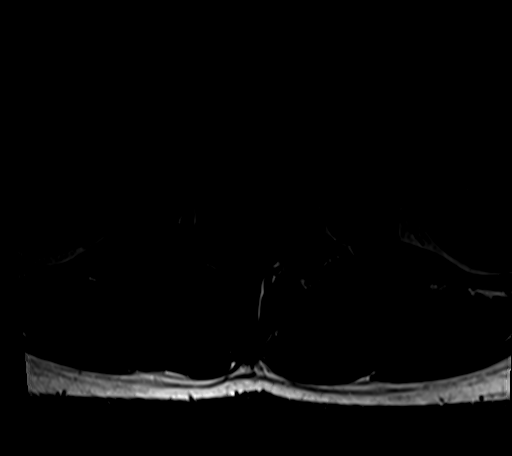

[25 of 48 positions shown; findings below may reference images not displayed]

FINDINGS: Segmentation:  Standard.

Alignment:  Dextrocurvature.  Retrolisthesis at L4-L5 and L5-S1.

Vertebrae: Degenerative endplate irregularity at L4-L5 and L5-S1.
Vertebral body heights are otherwise maintained. There is no
substantial marrow edema. No suspicious osseous lesion.

Conus medullaris and cauda equina: Conus extends to the L2 level.
Conus and cauda equina appear normal.

Paraspinal and other soft tissues: Unremarkable.

Disc levels:

L1-L2:  No canal or foraminal stenosis.

L2-L3:  No canal or foraminal stenosis.

L3-L4: Disc bulge with superimposed left foraminal/far lateral
protrusion. Minor canal stenosis. No right foraminal stenosis. Mild
to moderate left foraminal stenosis. The exiting left L3 nerve root
is displaced and likely compressed.

L4-L5: Disc bulge with endplate osteophytic ridging and superimposed
central disc extrusion extending below the disc level. Mild facet
arthropathy ligamentum flavum infolding. Mild canal stenosis.
Effacement of the left greater than right subarticular recesses.
Minor right and moderate left foraminal stenosis.

L5-S1: Disc bulge with endplate osteophytic ridging and superimposed
central disc protrusion. Mild facet arthropathy with ligamentum
flavum infolding. No canal stenosis. Partial effacement of the
subarticular recesses. Mild foraminal stenosis.
IMPRESSION: Multilevel degenerative changes as detailed above. There is no
high-grade canal narrowing. Subarticular recess stenosis is present
at L4-L5 greater than L5-S1. Left foraminal/far lateral protrusion
at L3-L4 likely compresses exiting left L3 nerve root.

## 2023-11-24 ENCOUNTER — Encounter (HOSPITAL_BASED_OUTPATIENT_CLINIC_OR_DEPARTMENT_OTHER): Payer: Self-pay | Admitting: Emergency Medicine

## 2023-11-24 ENCOUNTER — Other Ambulatory Visit: Payer: Self-pay

## 2023-11-24 ENCOUNTER — Emergency Department (HOSPITAL_BASED_OUTPATIENT_CLINIC_OR_DEPARTMENT_OTHER): Admitting: Radiology

## 2023-11-24 ENCOUNTER — Emergency Department (HOSPITAL_BASED_OUTPATIENT_CLINIC_OR_DEPARTMENT_OTHER): Admission: EM | Admit: 2023-11-24 | Discharge: 2023-11-24 | Disposition: A | Discharge status: Home / Self Care

## 2023-11-24 DIAGNOSIS — F458 Other somatoform disorders: Secondary | ICD-10-CM | POA: Insufficient documentation

## 2023-11-24 DIAGNOSIS — R0789 Other chest pain: Secondary | ICD-10-CM | POA: Insufficient documentation

## 2023-11-24 DIAGNOSIS — R059 Cough, unspecified: Secondary | ICD-10-CM | POA: Insufficient documentation

## 2023-11-24 LAB — BASIC METABOLIC PANEL WITH GFR
Anion gap: 13 (ref 5–15)
BUN: 10 mg/dL (ref 6–20)
CO2: 24 mmol/L (ref 22–32)
Calcium: 9.3 mg/dL (ref 8.9–10.3)
Chloride: 104 mmol/L (ref 98–111)
Creatinine, Ser: 1.27 mg/dL — ABNORMAL HIGH (ref 0.61–1.24)
GFR, Estimated: >60 mL/min (ref >60)
Glucose, Bld: 123 mg/dL — ABNORMAL HIGH (ref 70–99)
Potassium: 3.9 mmol/L (ref 3.5–5.1)
Sodium: 140 mmol/L (ref 135–145)

## 2023-11-24 LAB — RESP PANEL BY RT-PCR (RSV, FLU A&B, COVID)  RVPGX2
Influenza A by PCR: NEGATIVE
Influenza B by PCR: NEGATIVE
Resp Syncytial Virus by PCR: NEGATIVE
SARS Coronavirus 2 by RT PCR: NEGATIVE

## 2023-11-24 LAB — CBC
HCT: 41.9 % (ref 39.0–52.0)
Hemoglobin: 13.7 g/dL (ref 13.0–17.0)
MCH: 29.1 pg (ref 26.0–34.0)
MCHC: 32.7 g/dL (ref 30.0–36.0)
MCV: 89.1 fL (ref 80.0–100.0)
Platelets: 213 K/uL (ref 150–400)
RBC: 4.7 MIL/uL (ref 4.22–5.81)
RDW: 12.5 % (ref 11.5–15.5)
WBC: 6.6 K/uL (ref 4.0–10.5)
nRBC: 0 % (ref 0.0–0.2)

## 2023-11-24 LAB — TROPONIN T, HIGH SENSITIVITY
Troponin T High Sensitivity: <15 ng/L (ref <19)
Troponin T High Sensitivity: <15 ng/L (ref <19)

## 2023-11-24 MED ORDER — KETOROLAC TROMETHAMINE 15 MG/ML IJ SOLN
15.0000 mg | Freq: Once | INTRAMUSCULAR | Status: AC
  Administered 2023-11-24: 15 mg via INTRAVENOUS

## 2023-11-24 MED ORDER — SODIUM CHLORIDE 0.9 % IV BOLUS
1000.0000 mL | Freq: Once | INTRAVENOUS | Status: AC
  Administered 2023-11-24: 1000 mL via INTRAVENOUS

## 2023-11-24 MED ORDER — KETOROLAC TROMETHAMINE 15 MG/ML IJ SOLN: 15.0000 mg | Freq: Once | INTRAMUSCULAR | Status: DC

## 2023-11-24 MED ORDER — BENZONATATE 100 MG PO CAPS: 100.0000 mg | ORAL_CAPSULE | Freq: Three times a day (TID) | ORAL | 0 refills | Status: DC

## 2023-11-24 MED ORDER — CYCLOBENZAPRINE HCL 10 MG PO TABS
10.0000 mg | ORAL_TABLET | Freq: Once | ORAL | Status: AC
  Administered 2023-11-24: 10 mg via ORAL
  Filled 2023-11-24: qty 1

## 2023-11-24 MED ORDER — KETOROLAC TROMETHAMINE 15 MG/ML IJ SOLN
15.0000 mg | Freq: Once | INTRAMUSCULAR | Status: DC
  Filled 2023-11-24: qty 1

## 2023-11-24 MED ORDER — NAPROXEN 500 MG PO TABS: 500.0000 mg | ORAL_TABLET | Freq: Two times a day (BID) | ORAL | 0 refills | Status: DC

## 2023-11-24 MED ORDER — CYCLOBENZAPRINE HCL 10 MG PO TABS: 10.0000 mg | ORAL_TABLET | Freq: Two times a day (BID) | ORAL | 0 refills | Status: DC | PRN

## 2023-11-24 NOTE — Discharge Instructions (Signed)
 It was a pleasure taking part in your care.  As discussed, I believe that you have right sided jaw pain because you are grinding her teeth at night.  Please purchase a mouthguard over-the-counter and follow-up with a dentist.  Please take Flexeril , muscle relaxer, at night for pain.  He may also take naproxen  twice a day.  For your chest pain, your work appears reassuring.  Please follow-up with your PCP for further management and care.  Return to the ED with any worsening symptoms.

## 2023-11-24 NOTE — ED Triage Notes (Signed)
 Right side jaw pain  Had similar in feb  Also chest pain and congestion   X 1 week  Getting worse

## 2023-11-24 NOTE — ED Notes (Signed)
 DC paperwork given and verbally understood.

## 2023-11-24 NOTE — ED Provider Notes (Signed)
 [place] EMERGENCY DEPARTMENT AT [facility] Provider Note   CSN: [id number] Arrival date & time: 11/24/23  1130     Patient presents with: Jaw Pain   Devon Martinez is a 53 y.o. male with medical history of prediabetes, atypical chest pain, palpitations.  Patient presents to the ED for evaluation of right-sided jaw pain, chest pain, URI symptoms.  States that he has had jaw pain for the last 1 week.  States that he has past history of jaw pain.  Has not seen dentist for this.  Denies any ear pain, loss of hearing, dental pain, fevers, nausea or vomiting.  Also complaining of sore throat 2 days ago in the setting of new cough, body aches and chills, subjective fever.  Denies any known sick contacts.  Reports that when he coughs it causes pain in his chest.  Denies shortness of breath, lightheadedness, dizziness, weakness.  Denies any medications prior to arrival.  States that his chest pain is no different when he ambulates.  States that chest pain is exacerbated with coughing.  Reports that when he is not coughing, he has no chest pain.   HPI     Prior to Admission medications   Medication Sig Start Date End Date Taking? Authorizing Provider  cyclobenzaprine  (FLEXERIL ) 10 MG tablet Take 1 tablet (10 mg total) by mouth 2 (two) times daily as needed for muscle spasms. 11/24/23  Yes Ruthell Lonni FALCON, PA-C  naproxen  (NAPROSYN ) 500 MG tablet Take 1 tablet (500 mg total) by mouth 2 (two) times daily. 11/24/23  Yes Ruthell Lonni FALCON, PA-C  amoxicillin  (AMOXIL ) 500 MG capsule Take 1 capsule (500 mg total) by mouth 3 (three) times daily. 08/22/21   Joldersma, Logan, PA-C  famotidine  (PEPCID ) 40 MG tablet Take 1 tablet (40 mg total) by mouth daily for 15 days. 06/12/23 06/27/23  Mannie Fairy DASEN, DO  fluticasone  (FLONASE ) 50 MCG/ACT nasal spray Place 1 spray into both nostrils 2 (two) times daily. Patient not taking: Reported on 07/09/2018 10/17/17   Melonie Colonel, Mikel HERO, MD  gabapentin   (NEURONTIN ) 300 MG capsule Take 1 capsule (300 mg total) by mouth 3 (three) times daily. 08/04/22   Patsey Lot, MD  ibuprofen  (ADVIL ,MOTRIN ) 800 MG tablet Take 0.5 tablets (400 mg total) by mouth every 8 (eight) hours as needed for pain or fever. 05/22/11   Ricky Fines, MD  methocarbamol  (ROBAXIN ) 500 MG tablet Take 1 tablet (500 mg total) by mouth 2 (two) times daily. 04/08/21   Tonette Lauraine HERO, PA-C  predniSONE  (DELTASONE ) 20 MG tablet Take 2 tablets (40 mg total) by mouth daily. 08/04/22   Patsey Lot, MD    Allergies: Patient has no known allergies.    Review of Systems  Cardiovascular:  Positive for chest pain.  All other systems reviewed and are negative.   Updated Vital Signs BP 118/82   Pulse 74   Temp 99.1 F (37.3 C) (Oral)   Resp 19   SpO2 98%   Physical Exam Vitals and nursing note reviewed.  Constitutional:      General: He is not in acute distress.    Appearance: He is well-developed.  HENT:     Head: Normocephalic and atraumatic.     Comments: Tenderness right TMJ.  No overlying skin change, effusion, erythema or warmth Eyes:     Conjunctiva/sclera: Conjunctivae normal.  Cardiovascular:     Rate and Rhythm: Normal rate and regular rhythm.     Heart sounds: No murmur heard. Pulmonary:  Effort: Pulmonary effort is normal. No respiratory distress.     Breath sounds: Normal breath sounds.  Abdominal:     Palpations: Abdomen is soft.     Tenderness: There is no abdominal tenderness.  Musculoskeletal:        General: No swelling.     Cervical back: Neck supple.  Skin:    General: Skin is warm and dry.     Capillary Refill: Capillary refill takes less than 2 seconds.  Neurological:     Mental Status: He is alert and oriented to person, place, and time. Mental status is at baseline.  Psychiatric:        Mood and Affect: Mood normal.     (all labs ordered are listed, but only abnormal results are displayed) Labs Reviewed  BASIC METABOLIC  PANEL WITH GFR - Abnormal; Notable for the following components:      Result Value   Glucose, Bld 123 (*)    Creatinine, Ser 1.27 (*)    All other components within normal limits  RESP PANEL BY RT-PCR (RSV, FLU A&B, COVID)  RVPGX2  CBC  TROPONIN T, HIGH SENSITIVITY  TROPONIN T, HIGH SENSITIVITY    EKG: EKG Interpretation Date/Time:  Sunday November 24 2023 11:53:09 EDT Ventricular Rate:  91 PR Interval:  140 QRS Duration:  87 QT Interval:  341 QTC Calculation: 420 R Axis:   43  Text Interpretation: Sinus rhythm Probable left atrial enlargement Confirmed by Simon Rea [phone]) on 11/24/2023 12:25:44 PM  Radiology: DG Chest 2 View Result Date: 11/24/2023 CLINICAL DATA:  Right jaw pain, chest pain, and congestion for 1 week EXAM: CHEST - 2 VIEW COMPARISON:  Report from 06/07/2020 FINDINGS: Postoperative findings in the lower cervical spine. The lungs appear clear. Cardiac and mediastinal contours normal. No blunting of the costophrenic angles. No significant bony findings observed. IMPRESSION: 1. No active cardiopulmonary disease is radiographically apparent. 2. Postoperative findings in the lower cervical spine. Electronically Signed   By: Ryan Salvage M.D.   On: 11/24/2023 12:37     Procedures   Medications Ordered in the ED  cyclobenzaprine  (FLEXERIL ) tablet 10 mg (10 mg Oral Given 11/24/23 1221)  ketorolac  (TORADOL ) 15 MG/ML injection 15 mg (15 mg Intravenous Given 11/24/23 1224)  sodium chloride  0.9 % bolus 1,000 mL (0 mLs Intravenous Stopped 11/24/23 1414)    Medical Decision Making Amount and/or Complexity of Data Reviewed Labs: ordered. Radiology: ordered.  Risk Prescription drug management.   This is a 53 year old male who presents for evaluation of right-sided TMJ pain as well as atypical chest pain.  He is overall nontoxic in appearance.  He has reassuring vital signs.  He is not tachycardic, he is afebrile.  His lung sounds are clear bilaterally and he is not  hypoxic.  His abdomen is soft and compressible.  His neurological examinations are baseline.  Patient chart was reviewed.  Patient has been seen multiple times for right-sided TMJ.  I suspect that he has bruxism.  He reports he does not wear a mouthguard at night.  He reports he has been given muscle relaxers in the past which do help to relieve his pain.  He denies any trauma to his face.  Denies any ear pain.  Denies any decreased hearing.  Suspect patient jaw pain most likely secondary to bruxism.  Have encouraged him to purchase mouthguard, will provide him with muscle relaxer and naproxen .  In terms of the patient chest pain.  His troponin here has been negative x  2.  His metabolic panel shows a creatinine 1.27 which is increased from baseline 0.92 recorded 5 months ago.  The patient is tolerating p.o. intake here.  He has no nausea, vomiting or diarrhea.  He was given 1 L of fluid.  He was given Flexeril  and Toradol  for his pain in his jaw and chest and he reports that his pain is completely resolved.  His viral panel is negative.  Chest x-ray unremarkable.  EKG nonischemic.  At this time suspect patient right-sided TMJ pain secondary to bruxism.  Have encouraged him to purchase mouthguard will also send with Flexeril  and naproxen .  Patient cardiac labs are unremarkable.  Reports chest pain is resolved.  He will be advised to follow-up with his PCP.  He was given return precautions and he voiced understanding.  Stable to discharge.    Final diagnoses:  Atypical chest pain  Bruxism    ED Discharge Orders          Ordered    naproxen  (NAPROSYN ) 500 MG tablet  2 times daily        11/24/23 1501    cyclobenzaprine  (FLEXERIL ) 10 MG tablet  2 times daily PRN        11/24/23 1501               Ruthell Lonni FALCON, PA-C 11/24/23 1507    Simon Lavonia SAILOR, MD 11/25/23 [phone]

## 2023-12-03 ENCOUNTER — Ambulatory Visit
Admission: EM | Admit: 2023-12-03 | Discharge: 2023-12-03 | Disposition: A | Discharge status: Home / Self Care | Attending: Family Medicine | Admitting: Family Medicine

## 2023-12-03 ENCOUNTER — Ambulatory Visit (INDEPENDENT_AMBULATORY_CARE_PROVIDER_SITE_OTHER)

## 2023-12-03 ENCOUNTER — Encounter: Payer: Self-pay | Admitting: Emergency Medicine

## 2023-12-03 DIAGNOSIS — R051 Acute cough: Secondary | ICD-10-CM

## 2023-12-03 DIAGNOSIS — M26621 Arthralgia of right temporomandibular joint: Secondary | ICD-10-CM | POA: Diagnosis not present

## 2023-12-03 MED ORDER — AZITHROMYCIN 250 MG PO TABS: 250.0000 mg | ORAL_TABLET | Freq: Every day | ORAL | 0 refills | Status: DC

## 2023-12-03 MED ORDER — HYDROCODONE BIT-HOMATROP MBR 5-1.5 MG/5ML PO SOLN: 5.0000 mL | Freq: Four times a day (QID) | ORAL | 0 refills | Status: DC | PRN

## 2023-12-03 MED ORDER — DEXAMETHASONE SODIUM PHOSPHATE 10 MG/ML IJ SOLN
10.0000 mg | Freq: Once | INTRAMUSCULAR | Status: AC
  Administered 2023-12-03: 10 mg via INTRAMUSCULAR

## 2023-12-03 NOTE — ED Triage Notes (Addendum)
 Pt presents c/o right jaw pain, nasal congestion, and a productive cough producing green mucus x 2 weeks. Pt has been seen previously for the jaw pain but was told it was inflammation in the jaw. Pt was prescribed Benzonatate  and Naproxen  for the sxs but states the sxs have not improved.

## 2023-12-03 NOTE — Discharge Instructions (Signed)
 Be aware, your cough medication may cause drowsiness. Please do not drive, operate heavy machinery or make important decisions while on this medication, it can cloud your judgement.  Meds ordered this encounter  Medications   dexamethasone (DECADRON) injection 10 mg   HYDROcodone bit-homatropine (HYCODAN) 5-1.5 MG/5ML syrup    Sig: Take 5 mLs by mouth every 6 (six) hours as needed for cough.    Dispense:  90 mL    Refill:  0   azithromycin (ZITHROMAX) 250 MG tablet    Sig: Take 1 tablet (250 mg total) by mouth daily. Take first 2 tablets together, then 1 every day until finished.    Dispense:  6 tablet    Refill:  0

## 2023-12-04 ENCOUNTER — Telehealth: Payer: Self-pay

## 2023-12-04 NOTE — ED Provider Notes (Signed)
 [facility]   [id number] 12/03/23 Arrival Time: 1355  ASSESSMENT & PLAN:  1. Acute cough   2. Arthralgia of right temporomandibular joint    Coughing for two weeks. I have personally viewed and independently interpreted the imaging studies ordered this visit. CXR: no acute changes.  DG Chest 2 View Result Date: 12/03/2023 CLINICAL DATA:  Productive cough for 2 weeks. EXAM: DG CHEST 2V COMPARISON:  11/24/2023 FINDINGS: The cardiomediastinal contours are normal. The lungs are clear. Pulmonary vasculature is normal. No consolidation, pleural effusion, or pneumothorax. No acute osseous abnormalities are seen. Lower cervical spine hardware is partially included in the field of view. IMPRESSION: No active cardiopulmonary disease. Electronically Signed   By: Andrea Gasman M.D.   On: 12/03/2023 16:10   Feel he does have TMJ inflammation; discussed.  Trial of: Meds ordered this encounter  Medications   dexamethasone  (DECADRON ) injection 10 mg   HYDROcodone  bit-homatropine (HYCODAN) 5-1.5 MG/5ML syrup    Sig: Take 5 mLs by mouth every 6 (six) hours as needed for cough.    Dispense:  90 mL    Refill:  0   azithromycin  (ZITHROMAX ) 250 MG tablet    Sig: Take 1 tablet (250 mg total) by mouth daily. Take first 2 tablets together, then 1 every day until finished.    Dispense:  6 tablet    Refill:  0    Follow-up Information     Gerome Brunet, DO.   Specialty: Family Medicine Why: If worsening or failing to improve as anticipated. Contact information: [address] [place] [address] [phone]                 Reviewed expectations re: course of current medical issues. Questions answered. Outlined signs and symptoms indicating need for more acute intervention. Understanding verbalized. After Visit Summary given.   SUBJECTIVE: History from: Patient. Devon Martinez is a 53 y.o. male. Pt presents c/o right jaw pain, nasal congestion, and a  productive cough producing green mucus x 2 weeks. Pt has been seen previously for the jaw pain but was told it was inflammation in the jaw. Pt was prescribed Benzonatate  and Naproxen  for the sxs but states the sxs have not improved.  Last ED note reviewed. Denies: fever. Normal PO intake without n/v/d.  OBJECTIVE:  Vitals:   12/03/23 1538 12/03/23 1539  BP:  (!) 146/90  Pulse:  75  Resp:  18  Temp:  97.7 F (36.5 C)  TempSrc:  Oral  SpO2:  97%  Weight: 88.5 kg     General appearance: alert; no distress Eyes: PERRLA; EOMI; conjunctiva normal HENT: [place]; AT; with nasal congestion; is TTP over R TMJ with normal jaw movement; without mastoid TTP Neck: supple  Lungs: speaks full sentences without difficulty; unlabored; active coughing Extremities: no edema Skin: warm and dry Neurologic: normal gait Psychological: alert and cooperative; normal mood and affect  Labs:  Labs Reviewed - No data to display  Imaging: DG Chest 2 View Result Date: 12/03/2023 CLINICAL DATA:  Productive cough for 2 weeks. EXAM: DG CHEST 2V COMPARISON:  11/24/2023 FINDINGS: The cardiomediastinal contours are normal. The lungs are clear. Pulmonary vasculature is normal. No consolidation, pleural effusion, or pneumothorax. No acute osseous abnormalities are seen. Lower cervical spine hardware is partially included in the field of view. IMPRESSION: No active cardiopulmonary disease. Electronically Signed   By: Andrea Gasman M.D.   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 56, treated by [facility]   Prediabetes 09/2017   hgb a1c 5.8   Social History   Socioeconomic History   Marital status: Single    Spouse name: Not on file   Number of children: 5   Years of education: Not on file   Highest education level: Not on file  Occupational History   Not on file  Tobacco Use   Smoking status: Never     Passive exposure: Never   Smokeless tobacco: Never  Vaping Use   Vaping status: Never Used  Substance and Sexual Activity   Alcohol use: No   Drug use: No   Sexual activity: Yes  Other Topics Concern   Not on file  Social History Narrative   Not on file   Social Drivers of Health   Financial Resource Strain: Not on File (09/10/2022)   Received from [employer]    Financial Resource Strain: 0  Food Insecurity: Not at Risk (11/29/2022)   Received from [facility] Insecurity    Within the past 12 months, the food you bought just didn't last and you didn't have enough money to get more.: 1  Transportation Needs: Not at Risk (11/29/2022)   Received from [employer] Needs    In the past 12 months, has lack of transportation kept you from medical appointments, meetings, work or from getting things needed for daily living? (Check all that apply): 1  Physical Activity: Not on File (09/10/2022)   Received from [facility]   Physical Activity    Physical Activity: 0  Stress: Not on File (09/10/2022)   Received from [facility]   Stress    Stress: 0  Social Connections: Not on File (01/02/2023)   Received from [facility]   Social Connections    Connectedness: 0  Intimate Partner Violence: Not on file   Family History  Problem Relation Age of Onset   Hypertension Mother    Cancer Mother        Cervical   Heart disease Mother    Heart attack Mother    Hypertension Father    Cancer Father    Heart disease Father    Healthy Sister    Cancer Sister        Breast   Healthy Brother    Cancer Brother    Past Surgical History:  Procedure Laterality Date   APPENDECTOMY     MYRINGOTOMY  ~ 1987   bilaterally   KATY Rolinda Rogue, MD 12/04/23 [phone]

## 2023-12-04 NOTE — Telephone Encounter (Signed)
 Incoming call/information:  I have this patient here mrn [id number] [name] who was seen yesterday. he needs his hydrocodone  sent to [facility] on [address] please and thanks.   Outgoing call/information:  Attempted to contact patient and discuss. No answer. No identified VM.  See below upon return call/discussion:  HYDROcodone  bit-homatropine (HYCODAN) 5-1.5 MG/5ML syrup 90 mL 0 12/03/2023 --   Sig - Route: Take 5 mLs by mouth every 6 (six) hours as needed for cough. - Oral   Sent to pharmacy as: HYDROcodone  bit-homatropine (HYCODAN) 5-1.5 MG/5ML syrup   Earliest Fill Date: 12/03/2023   E-Prescribing Status: Receipt confirmed by pharmacy    &  azithromycin  (ZITHROMAX ) 250 MG tablet 6 tablet 0 12/03/2023 --   Sig - Route: Take 1 tablet (250 mg total) by mouth daily. Take first 2 tablets together, then 1 every day until finished. - Oral   Sent to pharmacy as: azithromycin  (ZITHROMAX ) 250 MG tablet   E-Prescribing Status: Receipt confirmed by pharmacy (12/03/2023  4:28 PM EDT)   Renewals  Renewal requests to authorizing provider Barnett, Redell, MD)     Both Sent to:  [id number] [place], [place] - [address] AT [facility] OF [facility] & [address] [place], [place] [address] Phone: [phone]  Fax: [phone] DEA #: [id number]

## 2023-12-05 DIAGNOSIS — Z1211 Encounter for screening for malignant neoplasm of colon: Secondary | ICD-10-CM | POA: Insufficient documentation

## 2023-12-05 DIAGNOSIS — R140 Abdominal distension (gaseous): Secondary | ICD-10-CM | POA: Insufficient documentation

## 2023-12-05 DIAGNOSIS — K625 Hemorrhage of anus and rectum: Secondary | ICD-10-CM | POA: Insufficient documentation

## 2023-12-05 NOTE — Telephone Encounter (Signed)
 Discussed call information/request with patient in clinic (he dropped by for the information).  Discussed with FABIENE Senters PA-C and Dr. Vonna who are not comfortable sending this codeine based cough medicine to the pharmacy. Patient should follow up with PCP asap.  WENDI [place] CMA   Advised patient.

## 2023-12-09 ENCOUNTER — Other Ambulatory Visit: Payer: Self-pay

## 2023-12-09 DIAGNOSIS — R591 Generalized enlarged lymph nodes: Secondary | ICD-10-CM

## 2023-12-12 ENCOUNTER — Other Ambulatory Visit

## 2023-12-13 ENCOUNTER — Other Ambulatory Visit

## 2023-12-16 ENCOUNTER — Inpatient Hospital Stay
Admission: RE | Admit: 2023-12-16 | Discharge: 2023-12-16 | Discharge status: Home / Self Care | Source: Ambulatory Visit

## 2023-12-16 DIAGNOSIS — R591 Generalized enlarged lymph nodes: Secondary | ICD-10-CM

## 2024-01-14 LAB — AMB RESULTS CONSOLE CBG: Glucose: 105

## 2024-01-14 NOTE — Progress Notes (Signed)
 Pt screened for HTN, non fasting BS. Denied SDOH screening. Blood pressure taken twice. He reports his BP was elevated at his physical 2 weeks ago and he has a upcoming appt with PCP. He states he has been having a circumferential HA with a feeling of film over his eyes. Encouraged him to go to the ED for further evaluation and work up. Educated him on untreated HTN and the risks. He agreed to go to the ED for evaluation

## 2024-01-15 ENCOUNTER — Encounter (HOSPITAL_COMMUNITY): Payer: Self-pay | Admitting: *Deleted

## 2024-01-15 ENCOUNTER — Other Ambulatory Visit: Payer: Self-pay

## 2024-01-15 ENCOUNTER — Emergency Department (HOSPITAL_COMMUNITY)
Admission: EM | Admit: 2024-01-15 | Discharge: 2024-01-16 | Disposition: A | Discharge status: Home / Self Care | Attending: Emergency Medicine | Admitting: Emergency Medicine

## 2024-01-15 DIAGNOSIS — R519 Headache, unspecified: Secondary | ICD-10-CM | POA: Diagnosis present

## 2024-01-15 DIAGNOSIS — I10 Essential (primary) hypertension: Secondary | ICD-10-CM | POA: Insufficient documentation

## 2024-01-15 LAB — COMPREHENSIVE METABOLIC PANEL WITH GFR
ALT: 19 U/L (ref 0–44)
AST: 21 U/L (ref 15–41)
Albumin: 3.8 g/dL (ref 3.5–5.0)
Alkaline Phosphatase: 57 U/L (ref 38–126)
Anion gap: 10 (ref 5–15)
BUN: 15 mg/dL (ref 6–20)
CO2: 27 mmol/L (ref 22–32)
Calcium: 9.2 mg/dL (ref 8.9–10.3)
Chloride: 104 mmol/L (ref 98–111)
Creatinine, Ser: 1.02 mg/dL (ref 0.61–1.24)
GFR, Estimated: >60 mL/min (ref >60)
Glucose, Bld: 90 mg/dL (ref 70–99)
Potassium: 4.2 mmol/L (ref 3.5–5.1)
Sodium: 141 mmol/L (ref 135–145)
Total Bilirubin: 0.8 mg/dL (ref 0.0–1.2)
Total Protein: 6.5 g/dL (ref 6.5–8.1)

## 2024-01-15 LAB — URINALYSIS, ROUTINE W REFLEX MICROSCOPIC
Bilirubin Urine: NEGATIVE
Glucose, UA: NEGATIVE mg/dL
Hgb urine dipstick: NEGATIVE
Ketones, ur: NEGATIVE mg/dL
Leukocytes,Ua: NEGATIVE
Nitrite: NEGATIVE
Protein, ur: NEGATIVE mg/dL
Specific Gravity, Urine: 1.014 (ref 1.005–1.030)
pH: 6 (ref 5.0–8.0)

## 2024-01-15 LAB — CBC WITH DIFFERENTIAL/PLATELET
Abs Immature Granulocytes: 0.02 K/uL (ref 0.00–0.07)
Basophils Absolute: 0.1 K/uL (ref 0.0–0.1)
Basophils Relative: 1 %
Eosinophils Absolute: 0.1 K/uL (ref 0.0–0.5)
Eosinophils Relative: 1 %
HCT: 41.3 % (ref 39.0–52.0)
Hemoglobin: 13.7 g/dL (ref 13.0–17.0)
Immature Granulocytes: 0 %
Lymphocytes Relative: 39 %
Lymphs Abs: 2.8 K/uL (ref 0.7–4.0)
MCH: 29.7 pg (ref 26.0–34.0)
MCHC: 33.2 g/dL (ref 30.0–36.0)
MCV: 89.6 fL (ref 80.0–100.0)
Monocytes Absolute: 0.6 K/uL (ref 0.1–1.0)
Monocytes Relative: 8 %
Neutro Abs: 3.6 K/uL (ref 1.7–7.7)
Neutrophils Relative %: 51 %
Platelets: 245 K/uL (ref 150–400)
RBC: 4.61 MIL/uL (ref 4.22–5.81)
RDW: 13.2 % (ref 11.5–15.5)
WBC: 7.1 K/uL (ref 4.0–10.5)
nRBC: 0 % (ref 0.0–0.2)

## 2024-01-15 NOTE — ED Triage Notes (Signed)
 The pt reports that he has had a headache for several days and he had his bp checked yesterday by a mobile truck and his bp was high  he has never been on bp meds

## 2024-01-15 NOTE — ED Provider Triage Note (Signed)
 Emergency Medicine Provider Triage Evaluation Note  Devon Martinez , a 53 y.o. male  was evaluated in triage.  Pt complains of headache.Seen at [facility] mobile truck yesterday with headaches and blurry vision. BP was high, advised to go to the ER then. Pt went home to lie down, took 4 81mg  ASA. BP still elevated and HA. Also taking beet capsule and raw garlic. Vision is better today.   Review of Systems  Positive:  Negative:   Physical Exam  BP (!) 143/99   Pulse 72   Temp 98.4 F (36.9 C)   Resp 16   Ht 6' (1.829 m)   Wt 88.5 kg   SpO2 98%   BMI 26.46 kg/m  Gen:   Awake, no distress   Resp:  Normal effort  MSK:   Moves extremities without difficulty  Other:    Medical Decision Making  Medically screening exam initiated at 5:39 PM.  Appropriate orders placed.  Devon Martinez was informed that the remainder of the evaluation will be completed by another provider, this initial triage assessment does not replace that evaluation, and the importance of remaining in the ED until their evaluation is complete.     Beverley Leita LABOR, PA-C 01/15/24 [phone]

## 2024-01-16 ENCOUNTER — Emergency Department (HOSPITAL_COMMUNITY)

## 2024-01-16 MED ORDER — AMLODIPINE BESYLATE 5 MG PO TABS: 5.0000 mg | ORAL_TABLET | Freq: Every day | ORAL | 0 refills | Status: AC

## 2024-01-16 MED ORDER — METOCLOPRAMIDE HCL 5 MG/ML IJ SOLN
10.0000 mg | Freq: Once | INTRAMUSCULAR | Status: AC
  Administered 2024-01-16: 10 mg via INTRAVENOUS
  Filled 2024-01-16: qty 2

## 2024-01-16 NOTE — Discharge Instructions (Signed)

## 2024-01-16 NOTE — ED Provider Notes (Signed)
 [place] [facility] AT [facility] Provider Note   CSN: [id number] Arrival date & time: 01/15/24  1642     Patient presents with: Headache and Hypertension   Devon Martinez is a 53 y.o. male.   The history is provided by the patient.  Headache Hypertension Associated symptoms include headaches.  Devon Martinez is a 53 y.o. male who presents to the Emergency Department complaining of high blood pressure and headache. He presents the emergency department for evaluation of symptoms that started one week ago with a migratory headache. He has experienced eye pain with this headache but that was two days ago. Headache was gradual and onset. Yesterday he stopped to get his blood pressure checked and it was elevated at 159/101. No associated fever, vomiting, numbness, weakness. He does report that his vision occasionally is blurred. He also reports that he had some palpitations this week, none currently today. He has no known medical problems and takes no routine medications. He did take for baby aspirin yesterday as well as today because of the headache. He does see his PCP yearly and did go for evaluation three weeks ago and is supposed to follow up on Monday. On his evaluation three weeks ago he was told that his blood pressure was a little bit high and he is scheduled to have this rechecked on Monday as well. No tobacco, alcohol, drug use.      Prior to Admission medications   Medication Sig Start Date End Date Taking? Authorizing Provider  amLODipine  (NORVASC ) 5 MG tablet Take 1 tablet (5 mg total) by mouth daily. 01/16/24 02/15/24 Yes Griselda Norris, MD  atorvastatin (LIPITOR) 20 MG tablet Take 20 mg by mouth daily. 04/08/23   [provider]  GARLIC PO Take 500 mg by mouth.    [provider]  LUTEIN PO Take 1 tablet by mouth daily.    [provider]    Allergies: Patient has no known allergies.    Review of Systems  Neurological:   Positive for headaches.  All other systems reviewed and are negative.   Updated Vital Signs BP (!) 140/97   Pulse (!) 53   Temp 98.2 F (36.8 C) (Oral)   Resp 14   Ht 6' (1.829 m)   Wt 88.5 kg   SpO2 100%   BMI 26.46 kg/m   Physical Exam Vitals and nursing note reviewed.  Constitutional:      Appearance: He is well-developed.  HENT:     Head: Normocephalic and atraumatic.  Cardiovascular:     Rate and Rhythm: Normal rate and regular rhythm.     Heart sounds: No murmur heard. Pulmonary:     Effort: Pulmonary effort is normal. No respiratory distress.     Breath sounds: Normal breath sounds.  Abdominal:     Palpations: Abdomen is soft.     Tenderness: There is no abdominal tenderness. There is no guarding or rebound.  Musculoskeletal:        General: No tenderness.  Skin:    General: Skin is warm and dry.  Neurological:     Mental Status: He is alert and oriented to person, place, and time.     Comments: No asymmetry of facial movements, visual fields grossly intact.  5/5 strength in all four extremities with sensation to light touch intact in all four extremities.   Psychiatric:        Behavior: Behavior normal.     (all labs ordered are listed, but only  abnormal results are displayed) Labs Reviewed  CBC WITH DIFFERENTIAL/PLATELET  COMPREHENSIVE METABOLIC PANEL WITH GFR  URINALYSIS, ROUTINE W REFLEX MICROSCOPIC    EKG: EKG Interpretation Date/Time:  Thursday January 16 2024 01:46:45 EDT Ventricular Rate:  81 PR Interval:  150 QRS Duration:  97 QT Interval:  388 QTC Calculation: 451 R Axis:   42  Text Interpretation: Sinus rhythm Confirmed by Griselda Norris [phone]) on 01/16/2024 2:00:38 AM  Radiology: CT Head Wo Contrast Result Date: 01/16/2024 CLINICAL DATA:  Headache, new onset (Age >= 51y) EXAM: CT HEAD WITHOUT CONTRAST TECHNIQUE: Contiguous axial images were obtained from the base of the skull through the vertex without intravenous contrast.  RADIATION DOSE REDUCTION: This exam was performed according to the departmental dose-optimization program which includes automated exposure control, adjustment of the mA and/or kV according to patient size and/or use of iterative reconstruction technique. COMPARISON:  CT head February 12, 25. FINDINGS: Brain: No evidence of acute infarction, hemorrhage, hydrocephalus, extra-axial collection or mass lesion/mass effect. Vascular: No hyperdense vessel or unexpected calcification. Skull: No acute fracture. Sinuses/Orbits: Clear sinuses.  No acute orbital findings. Other: No mastoid effusions. IMPRESSION: No evidence of acute intracranial abnormality. Electronically Signed   By: Gilmore GORMAN Molt M.D.   On: 01/16/2024 02:06     Procedures   Medications Ordered in the ED  metoCLOPramide  (REGLAN ) injection 10 mg (10 mg Intravenous Given 01/16/24 0135)                                    Medical Decision Making Amount and/or Complexity of Data Reviewed Radiology: ordered.  Risk Prescription drug management.   Patient without significant medical history here for evaluation of headache as well as high blood pressure that was identified on outpatient visit. He is non-toxic appearing on evaluation with no focal neurologic deficits. CT head is negative for acute abnormality. Current picture is not consistent with hypertensive urgency, subarachnoid hemorrhage, meningitis, dural sinus thrombosis. His headache improved after treatment with metoclopramide . His blood pressure also did improve after this treatment. Discussed with patient home care for headache as well as hypertension. Will start on medication. Discussed PCP follow-up as well as return precautions.     Final diagnoses:  Bad headache  Essential hypertension  Uncontrolled hypertension    ED Discharge Orders          Ordered    amLODipine  (NORVASC ) 5 MG tablet  Daily        01/16/24 0240               Griselda Norris,  MD 01/16/24 [phone]

## 2024-02-28 NOTE — Progress Notes (Signed)
 The patient attended a screening event on 01/14/2024, where his blood pressure was measured at 154/107 mmHg after a second check, and his non-fasting blood glucose was 105 mg/dl. During the event, the patient reported that he does not smoke, has insurance, is established with a primary care provider, and declined the SDOH screener.  A chart review indicates that the patient is being seen by [facility] ([facility]). The patient's most recent office visit was on 02/03/2024 with Dr. Starleen Monte at [facility]. Chart review indicates he has an upcoming appt with this provider on 05/11/2024 at 1:30 PM. Notes from this appt further indicates this provider is his PCP. Pt was also seen by this provider right after the screening event on 01/20/2024. [facility] updated PCP to reflect this. His bp was addressed at both visits. Pt has [employer] for his insurance and no SDOH needs indicated at this time.   [facility]W sent courtesy letter with bp education and healthy cooking class flyer just in case needed by th pt.  At this time, no additional support from the Health Equity Team is indicated.
